# Patient Record
Sex: Female | Born: 1960 | Race: White | Hispanic: No | Smoking: Current every day smoker
Health system: Southern US, Community
[De-identification: ages and names within clinical notes are randomized; demographics above are authoritative.]

## PROBLEM LIST (undated history)

## (undated) DIAGNOSIS — F419 Anxiety disorder, unspecified: Secondary | ICD-10-CM

## (undated) DIAGNOSIS — F32A Depression, unspecified: Secondary | ICD-10-CM

## (undated) DIAGNOSIS — F319 Bipolar disorder, unspecified: Secondary | ICD-10-CM

## (undated) DIAGNOSIS — K219 Gastro-esophageal reflux disease without esophagitis: Secondary | ICD-10-CM

## (undated) DIAGNOSIS — F329 Major depressive disorder, single episode, unspecified: Secondary | ICD-10-CM

## (undated) HISTORY — PX: TUBAL LIGATION: SHX77

---

## 2000-05-13 ENCOUNTER — Inpatient Hospital Stay (HOSPITAL_COMMUNITY): Admission: EM | Admit: 2000-05-13 | Discharge: 2000-05-16 | Payer: Self-pay | Admitting: *Deleted

## 2007-10-04 ENCOUNTER — Encounter (INDEPENDENT_AMBULATORY_CARE_PROVIDER_SITE_OTHER): Payer: Self-pay | Admitting: Family Medicine

## 2007-10-16 ENCOUNTER — Encounter (INDEPENDENT_AMBULATORY_CARE_PROVIDER_SITE_OTHER): Payer: Self-pay | Admitting: Family Medicine

## 2007-10-22 ENCOUNTER — Ambulatory Visit: Payer: Self-pay | Admitting: Family Medicine

## 2007-10-22 DIAGNOSIS — K219 Gastro-esophageal reflux disease without esophagitis: Secondary | ICD-10-CM

## 2007-10-22 DIAGNOSIS — B977 Papillomavirus as the cause of diseases classified elsewhere: Secondary | ICD-10-CM

## 2007-10-22 DIAGNOSIS — F259 Schizoaffective disorder, unspecified: Secondary | ICD-10-CM

## 2007-10-24 ENCOUNTER — Ambulatory Visit: Payer: Self-pay | Admitting: Family Medicine

## 2008-02-26 ENCOUNTER — Encounter (INDEPENDENT_AMBULATORY_CARE_PROVIDER_SITE_OTHER): Payer: Self-pay | Admitting: Internal Medicine

## 2008-03-16 ENCOUNTER — Ambulatory Visit (HOSPITAL_COMMUNITY): Admission: RE | Admit: 2008-03-16 | Discharge: 2008-03-16 | Payer: Self-pay | Admitting: Family Medicine

## 2008-04-29 ENCOUNTER — Telehealth (INDEPENDENT_AMBULATORY_CARE_PROVIDER_SITE_OTHER): Payer: Self-pay | Admitting: Family Medicine

## 2008-05-07 ENCOUNTER — Encounter (INDEPENDENT_AMBULATORY_CARE_PROVIDER_SITE_OTHER): Payer: Self-pay | Admitting: Family Medicine

## 2008-08-02 ENCOUNTER — Ambulatory Visit: Payer: Self-pay | Admitting: Family Medicine

## 2008-08-13 ENCOUNTER — Encounter (INDEPENDENT_AMBULATORY_CARE_PROVIDER_SITE_OTHER): Payer: Self-pay | Admitting: Family Medicine

## 2008-08-15 DIAGNOSIS — R51 Headache: Secondary | ICD-10-CM | POA: Insufficient documentation

## 2008-08-15 DIAGNOSIS — R519 Headache, unspecified: Secondary | ICD-10-CM | POA: Insufficient documentation

## 2008-09-19 ENCOUNTER — Emergency Department (HOSPITAL_BASED_OUTPATIENT_CLINIC_OR_DEPARTMENT_OTHER): Admission: EM | Admit: 2008-09-19 | Discharge: 2008-09-19 | Payer: Self-pay | Admitting: Emergency Medicine

## 2008-10-21 ENCOUNTER — Ambulatory Visit: Payer: Self-pay | Admitting: *Deleted

## 2008-10-21 ENCOUNTER — Inpatient Hospital Stay (HOSPITAL_COMMUNITY): Admission: EM | Admit: 2008-10-21 | Discharge: 2008-10-27 | Payer: Self-pay | Admitting: *Deleted

## 2009-02-18 ENCOUNTER — Telehealth (INDEPENDENT_AMBULATORY_CARE_PROVIDER_SITE_OTHER): Payer: Self-pay | Admitting: Internal Medicine

## 2009-03-11 ENCOUNTER — Ambulatory Visit: Payer: Self-pay | Admitting: Physician Assistant

## 2009-03-29 ENCOUNTER — Telehealth: Payer: Self-pay | Admitting: Physician Assistant

## 2009-04-15 ENCOUNTER — Ambulatory Visit (HOSPITAL_COMMUNITY): Admission: RE | Admit: 2009-04-15 | Discharge: 2009-04-15 | Payer: Self-pay | Admitting: Family Medicine

## 2010-04-13 ENCOUNTER — Inpatient Hospital Stay (HOSPITAL_COMMUNITY): Admission: RE | Admit: 2010-04-13 | Discharge: 2010-04-20 | Payer: Self-pay | Admitting: Psychiatry

## 2010-04-13 ENCOUNTER — Ambulatory Visit: Payer: Self-pay | Admitting: Psychiatry

## 2010-09-20 ENCOUNTER — Emergency Department (HOSPITAL_COMMUNITY)
Admission: EM | Admit: 2010-09-20 | Discharge: 2010-09-21 | Disposition: A | Discharge status: Other Acute Inpatient Hospital | Payer: Medicare Other | Source: Home / Self Care | Attending: Emergency Medicine | Admitting: Emergency Medicine

## 2010-09-20 DIAGNOSIS — R45851 Suicidal ideations: Secondary | ICD-10-CM | POA: Insufficient documentation

## 2010-09-20 DIAGNOSIS — F259 Schizoaffective disorder, unspecified: Secondary | ICD-10-CM | POA: Insufficient documentation

## 2010-09-20 DIAGNOSIS — Z79899 Other long term (current) drug therapy: Secondary | ICD-10-CM | POA: Insufficient documentation

## 2010-09-20 DIAGNOSIS — Z880 Allergy status to penicillin: Secondary | ICD-10-CM | POA: Insufficient documentation

## 2010-09-20 DIAGNOSIS — F319 Bipolar disorder, unspecified: Secondary | ICD-10-CM | POA: Insufficient documentation

## 2010-09-20 DIAGNOSIS — F17200 Nicotine dependence, unspecified, uncomplicated: Secondary | ICD-10-CM | POA: Insufficient documentation

## 2010-09-20 LAB — DIFFERENTIAL
Eosinophils Absolute: 0 10*3/uL (ref 0.0–0.7)
Eosinophils Relative: 1 % (ref 0–5)
Lymphs Abs: 1.9 10*3/uL (ref 0.7–4.0)

## 2010-09-20 LAB — URINE MICROSCOPIC-ADD ON

## 2010-09-20 LAB — COMPREHENSIVE METABOLIC PANEL
Albumin: 4 g/dL (ref 3.5–5.2)
BUN: 9 mg/dL (ref 6–23)
Creatinine, Ser: 0.76 mg/dL (ref 0.4–1.2)
Potassium: 3.1 mEq/L — ABNORMAL LOW (ref 3.5–5.1)
Total Protein: 7 g/dL (ref 6.0–8.3)

## 2010-09-20 LAB — URINALYSIS, ROUTINE W REFLEX MICROSCOPIC
Bilirubin Urine: NEGATIVE
Nitrite: NEGATIVE
Specific Gravity, Urine: 1.013 (ref 1.005–1.030)
Urobilinogen, UA: 0.2 mg/dL (ref 0.0–1.0)

## 2010-09-20 LAB — RAPID URINE DRUG SCREEN, HOSP PERFORMED
Amphetamines: NOT DETECTED
Tetrahydrocannabinol: NOT DETECTED

## 2010-09-20 LAB — CBC
MCH: 30.8 pg (ref 26.0–34.0)
MCV: 92 fL (ref 78.0–100.0)
Platelets: 315 10*3/uL (ref 150–400)
RDW: 13.6 % (ref 11.5–15.5)
WBC: 8.1 10*3/uL (ref 4.0–10.5)

## 2010-09-21 ENCOUNTER — Inpatient Hospital Stay (HOSPITAL_COMMUNITY)
Admission: AD | Admit: 2010-09-21 | Discharge: 2010-10-09 | DRG: 885 | Disposition: A | Discharge status: Nursing Home (Includes ICF) | Payer: Medicare Other | Source: Ambulatory Visit | Attending: Psychiatry | Admitting: Psychiatry

## 2010-09-21 DIAGNOSIS — Z818 Family history of other mental and behavioral disorders: Secondary | ICD-10-CM

## 2010-09-21 DIAGNOSIS — F259 Schizoaffective disorder, unspecified: Principal | ICD-10-CM

## 2010-09-21 DIAGNOSIS — R45851 Suicidal ideations: Secondary | ICD-10-CM

## 2010-09-21 DIAGNOSIS — K219 Gastro-esophageal reflux disease without esophagitis: Secondary | ICD-10-CM

## 2010-09-21 DIAGNOSIS — Z880 Allergy status to penicillin: Secondary | ICD-10-CM

## 2010-09-21 DIAGNOSIS — F17200 Nicotine dependence, unspecified, uncomplicated: Secondary | ICD-10-CM

## 2010-09-21 DIAGNOSIS — F603 Borderline personality disorder: Secondary | ICD-10-CM

## 2010-09-21 DIAGNOSIS — Z87440 Personal history of urinary (tract) infections: Secondary | ICD-10-CM

## 2010-09-21 DIAGNOSIS — Z5900 Homelessness unspecified: Secondary | ICD-10-CM

## 2010-09-21 DIAGNOSIS — F329 Major depressive disorder, single episode, unspecified: Secondary | ICD-10-CM

## 2010-09-21 DIAGNOSIS — F3289 Other specified depressive episodes: Secondary | ICD-10-CM

## 2010-09-21 DIAGNOSIS — Z560 Unemployment, unspecified: Secondary | ICD-10-CM

## 2010-09-22 LAB — URINE MICROSCOPIC-ADD ON

## 2010-09-22 LAB — URINALYSIS, ROUTINE W REFLEX MICROSCOPIC
Nitrite: NEGATIVE
Specific Gravity, Urine: 1.014 (ref 1.005–1.030)
pH: 6.5 (ref 5.0–8.0)

## 2010-09-22 LAB — TSH: TSH: 1.87 u[IU]/mL (ref 0.350–4.500)

## 2010-09-23 DIAGNOSIS — F603 Borderline personality disorder: Secondary | ICD-10-CM

## 2010-09-23 DIAGNOSIS — F259 Schizoaffective disorder, unspecified: Secondary | ICD-10-CM
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
## 2010-09-27 LAB — BASIC METABOLIC PANEL
BUN: 9 mg/dL (ref 6–23)
Creatinine, Ser: 0.78 mg/dL (ref 0.4–1.2)
GFR calc non Af Amer: >60 mL/min (ref >60)

## 2010-09-28 LAB — VITAMIN B12: Vitamin B-12: 351 pg/mL (ref 211–911)

## 2010-09-30 LAB — VITAMIN D 1,25 DIHYDROXY: Vitamin D3 1, 25 (OH)2: 68 pg/mL

## 2010-10-05 NOTE — Discharge Summary (Signed)
[name], [name]                ACCOUNT NO.:  [id number]  MEDICAL RECORD NO.:  [id number]           PATIENT TYPE:  I  LOCATION:  0502                          FACILITY:  [facility]  PHYSICIAN:  Marlis Edelson, DO        DATE OF BIRTH:  05-04-1961  DATE OF ADMISSION:  09/21/2010 DATE OF DISCHARGE:                              DISCHARGE SUMMARY   REASON FOR ADMISSION:  This is a 50 year old female with a longstanding history of mood disorder, brought in by the ACT team due to suicidal thoughts.  Patient was reporting that she was feeling very stressed out, was unable to provide details due to her symptoms.  Her stressors were that she was homeless, feeling very afraid, ruminating about past mistakes that she has made.  Denied any hallucinations.  FINAL DIAGNOSES:  Schizoaffective disorder, depressed. AXIS II:  Borderline personality disorder by history. AXIS III:  Gastroesophageal reflux disease symptoms. AXIS IV:  Psychosocial stressors, burden of illness. AXIS V:  Is 50 to 55.  SIGNIFICANT LABS:  Normal EKG.  Urine drug screen was negative.  Alcohol level less than 5.  CBC within normal limits.  SIGNIFICANT FINDINGS:  This was a middle-aged female admitted to the adult milieu in the mood disorder treatment therapy.  She appeared anxious and disheveled.  Eye contact was fair but more consistent with staring, seldom blinking.  Her motor behavior, she was initially restless, feeling very sad.  She seemed to have some paranoid ideation with poor judgment and shallow insight.  We were going to pursue placement and had Ativan available for anxiety.  Patient had periods of ruminating thoughts.  She was endorsing depressive symptoms and suicidal thoughts but no active thoughts or plan or intent.  She seems somewhat stagnated, always feeling the same.  We added Risperdal for the ruminating thoughts and trazodone for sleep.  We were weaning her Celexa due to its lack of efficacy.  Continued  Risperdal for psychosis and mood stability.  We added Rozerem for sleep as patient was not sleeping well and not feeling well, endorsing her symptoms of depression and rumination.  We continued to do some psychoeducation discussing her self- esteem issues and initiated Lamictal for mood stability and had discontinued her Rozerem and initiated Lunesta for sleep.  Patient continued on her regimen of Lamictal, Risperdal, and Lunesta, still having ruminating at night, beginning to feel somewhat better, was attending groups.  We were awaiting placement.  We started Klonopin for her anxiety.  She was feeling hopeless in regard to her homeless situation.  Her anxiety had lessened, still having some symptoms of anxiety, and was happy about placement although somewhat anxious about leaving the facility.  CONDITION UPON DISCHARGE:  Less depressed, less anxious, tolerating her medications, denying any psychotic symptoms, showing no signs of hypomania, mania, or overt anxiety.  DISCHARGE MEDICATIONS: 1. Lamictal 100 mg taking 1/2 tab daily. 2. Claritin 10 mg as needed for allergies. 3. Risperdal 3 mg 1 tab b.i.d. 4. Colace 100 mg b.i.d. 5. Klonopin 0.5 mg 1 tab b.i.d. 6. Lunesta 3 mg 1 tab q.h.s. sleep.  Patient  was to stop her BuSpar and Celexa.  Patient was to be discharged to the [facility].     Landry Corporal, N.P.   ______________________________ Marlis Edelson, DO    JO/MEDQ  D:  10/05/2010  T:  10/05/2010  Job:  [id number]  Electronically Signed by Limmie PatriciaP. on 10/05/2010 01:35:11 PM Electronically Signed by Marlis Edelson MD on 10/05/2010 09:21:34 PM

## 2010-10-17 ENCOUNTER — Emergency Department (HOSPITAL_COMMUNITY)
Admission: EM | Admit: 2010-10-17 | Discharge: 2010-10-18 | Disposition: A | Discharge status: Home / Self Care | Payer: Medicare Other | Attending: Emergency Medicine | Admitting: Emergency Medicine

## 2010-10-17 DIAGNOSIS — F319 Bipolar disorder, unspecified: Secondary | ICD-10-CM | POA: Insufficient documentation

## 2010-10-17 DIAGNOSIS — F259 Schizoaffective disorder, unspecified: Secondary | ICD-10-CM | POA: Insufficient documentation

## 2010-10-17 DIAGNOSIS — G4700 Insomnia, unspecified: Secondary | ICD-10-CM | POA: Insufficient documentation

## 2010-10-17 DIAGNOSIS — Z79899 Other long term (current) drug therapy: Secondary | ICD-10-CM | POA: Insufficient documentation

## 2010-10-17 LAB — CBC
HCT: 42.5 % (ref 36.0–46.0)
Hemoglobin: 14.3 g/dL (ref 12.0–15.0)
MCHC: 33.6 g/dL (ref 30.0–36.0)
RBC: 4.68 MIL/uL (ref 3.87–5.11)

## 2010-10-17 LAB — DIFFERENTIAL
Basophils Absolute: 0 10*3/uL (ref 0.0–0.1)
Lymphocytes Relative: 24 % (ref 12–46)
Monocytes Absolute: 0.9 10*3/uL (ref 0.1–1.0)
Monocytes Relative: 9 % (ref 3–12)
Neutro Abs: 6.8 10*3/uL (ref 1.7–7.7)

## 2010-10-18 LAB — ETHANOL: Alcohol, Ethyl (B): <5 mg/dL (ref 0–10)

## 2010-10-18 LAB — RAPID URINE DRUG SCREEN, HOSP PERFORMED
Amphetamines: NOT DETECTED
Benzodiazepines: NOT DETECTED
Cocaine: NOT DETECTED
Opiates: NOT DETECTED
Tetrahydrocannabinol: NOT DETECTED

## 2010-10-18 LAB — COMPREHENSIVE METABOLIC PANEL
Albumin: 4 g/dL (ref 3.5–5.2)
BUN: 13 mg/dL (ref 6–23)
Calcium: 9.3 mg/dL (ref 8.4–10.5)
Creatinine, Ser: 0.86 mg/dL (ref 0.4–1.2)
Potassium: 4 mEq/L (ref 3.5–5.1)
Total Protein: 7.3 g/dL (ref 6.0–8.3)

## 2010-10-21 ENCOUNTER — Emergency Department (HOSPITAL_COMMUNITY)
Admission: EM | Admit: 2010-10-21 | Discharge: 2010-10-22 | Disposition: A | Discharge status: Other Acute Inpatient Hospital | Payer: Medicare Other | Source: Home / Self Care | Attending: Emergency Medicine | Admitting: Emergency Medicine

## 2010-10-21 DIAGNOSIS — R443 Hallucinations, unspecified: Secondary | ICD-10-CM | POA: Insufficient documentation

## 2010-10-21 DIAGNOSIS — F329 Major depressive disorder, single episode, unspecified: Secondary | ICD-10-CM | POA: Insufficient documentation

## 2010-10-21 DIAGNOSIS — R45851 Suicidal ideations: Secondary | ICD-10-CM | POA: Insufficient documentation

## 2010-10-21 DIAGNOSIS — F3289 Other specified depressive episodes: Secondary | ICD-10-CM | POA: Insufficient documentation

## 2010-10-21 LAB — DIFFERENTIAL
Lymphocytes Relative: 25 % (ref 12–46)
Lymphs Abs: 2.4 10*3/uL (ref 0.7–4.0)
Monocytes Relative: 8 % (ref 3–12)
Neutro Abs: 6.1 10*3/uL (ref 1.7–7.7)
Neutrophils Relative %: 65 % (ref 43–77)

## 2010-10-21 LAB — COMPREHENSIVE METABOLIC PANEL
ALT: 15 U/L (ref 0–35)
AST: 16 U/L (ref 0–37)
Albumin: 3.6 g/dL (ref 3.5–5.2)
CO2: 26 mEq/L (ref 19–32)
Chloride: 103 mEq/L (ref 96–112)
Creatinine, Ser: 0.84 mg/dL (ref 0.4–1.2)
GFR calc Af Amer: >60 mL/min (ref >60)
Potassium: 4.1 mEq/L (ref 3.5–5.1)
Sodium: 136 mEq/L (ref 135–145)
Total Bilirubin: 0.3 mg/dL (ref 0.3–1.2)

## 2010-10-21 LAB — CBC
Hemoglobin: 13.9 g/dL (ref 12.0–15.0)
MCH: 31 pg (ref 26.0–34.0)
MCV: 92.7 fL (ref 78.0–100.0)
RBC: 4.49 MIL/uL (ref 3.87–5.11)
WBC: 9.4 10*3/uL (ref 4.0–10.5)

## 2010-10-21 LAB — RAPID URINE DRUG SCREEN, HOSP PERFORMED
Amphetamines: NOT DETECTED
Benzodiazepines: NOT DETECTED
Cocaine: NOT DETECTED

## 2010-10-22 ENCOUNTER — Inpatient Hospital Stay (HOSPITAL_COMMUNITY)
Admission: EM | Admit: 2010-10-22 | Discharge: 2010-10-26 | DRG: 885 | Disposition: A | Discharge status: Home / Self Care | Payer: Medicare Other | Source: Intra-hospital | Attending: Psychiatry | Admitting: Psychiatry

## 2010-10-22 DIAGNOSIS — Z87440 Personal history of urinary (tract) infections: Secondary | ICD-10-CM

## 2010-10-22 DIAGNOSIS — Z5900 Homelessness unspecified: Secondary | ICD-10-CM

## 2010-10-22 DIAGNOSIS — F39 Unspecified mood [affective] disorder: Secondary | ICD-10-CM

## 2010-10-22 DIAGNOSIS — F603 Borderline personality disorder: Secondary | ICD-10-CM

## 2010-10-22 DIAGNOSIS — F329 Major depressive disorder, single episode, unspecified: Secondary | ICD-10-CM

## 2010-10-22 DIAGNOSIS — F3289 Other specified depressive episodes: Secondary | ICD-10-CM

## 2010-10-22 DIAGNOSIS — R45851 Suicidal ideations: Secondary | ICD-10-CM

## 2010-10-22 DIAGNOSIS — F259 Schizoaffective disorder, unspecified: Principal | ICD-10-CM

## 2010-10-22 DIAGNOSIS — F17200 Nicotine dependence, unspecified, uncomplicated: Secondary | ICD-10-CM

## 2010-10-23 LAB — URINE MICROSCOPIC-ADD ON

## 2010-10-23 LAB — URINALYSIS, ROUTINE W REFLEX MICROSCOPIC
Bilirubin Urine: NEGATIVE
Glucose, UA: NEGATIVE mg/dL
Hgb urine dipstick: NEGATIVE
Protein, ur: NEGATIVE mg/dL
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
## 2010-10-25 DIAGNOSIS — F259 Schizoaffective disorder, unspecified: Secondary | ICD-10-CM

## 2010-10-30 NOTE — Discharge Summary (Signed)
  NAME:  [name], [name]                ACCOUNT NO.:  [id number]  MEDICAL RECORD NO.:  [id number]           PATIENT TYPE:  I  LOCATION:  0407                          FACILITY:  [facility]  PHYSICIAN:  Eulogio Ditch, MD DATE OF BIRTH:  [birth year]  DATE OF ADMISSION:  10/22/2010 DATE OF DISCHARGE:  10/26/2010                              DISCHARGE SUMMARY   HOSPITAL COURSE:  A 50 year old Caucasian female with diagnosis of schizoaffective disorder followed by the ACT Team in the outpatient setting who was admitted from [facility] ED after she was having depressed mood and suicidal ideations.  The patient also reported hearing voices at the time of admission.  During the hospital stay the patient participated in all the groups. She was continued on current combination of her medications Risperdal 3 mg twice a day, Celexa 40 mg daily and Tegretol 200 mg 2 tablets at bedtime along with BuSpar 10 mg b.i.d.  The patient responded to the medication well.  She went to all the groups and her behavior remained under control during the stay in the hospital.  The patient currently does not like the group home where she is living, she want to stay at [facility] she gets from disability 1300 dollars and she told me that for a month [employer] rent is 525.  She wants to follow up with the ACT team to find another group home after discharge from the hospital.  On October 26, 2010 when I saw the patient.  The patient is mentally stable, not hearing any voices.  Denied any suicidal ideations.  Her attention and concentration is good.  Abstraction ability is good. Insight and judgment improved.  Her memory immediate, recent remote intact.  As the patient was found stable to be discharged, the patient was discharged to follow up with [facility] team.  DISCHARGE DIAGNOSES:  AXIS I: Schizoaffective disorder mixed type. AXIS II: Deferred. AXIS III: No active medical issue. AXIS IV: Chronic mental issues. AXIS V:  50.  DISCHARGE MEDICATIONS: 1. Psych medication Celexa 40 mg p.o. daily. 2. Risperdal 3 mg b.i.d. 3. Tegretol 200 mg 12 tablets at bedtime. 4. BuSpar 10 mg t.i.d. 5. Klonopin 0.5 mg b.i.d.  DISCHARGE FOLLOWUP:  The patient with follow-up with the [facility] in the outpatient setting.     Eulogio Ditch, MD     SA/MEDQ  D:  10/26/2010  T:  10/26/2010  Job:  [id number]  Electronically Signed by Eulogio Ditch  on 10/30/2010 11:50:02 AM

## 2010-11-01 LAB — COMPREHENSIVE METABOLIC PANEL
ALT: 18 U/L (ref 0–35)
AST: 21 U/L (ref 0–37)
Albumin: 4.2 g/dL (ref 3.5–5.2)
Alkaline Phosphatase: 113 U/L (ref 39–117)
BUN: 9 mg/dL (ref 6–23)
CO2: 29 mEq/L (ref 19–32)
Calcium: 9.9 mg/dL (ref 8.4–10.5)
Chloride: 102 mEq/L (ref 96–112)
Creatinine, Ser: 0.94 mg/dL (ref 0.4–1.2)
GFR calc Af Amer: >60 mL/min (ref >60)
GFR calc non Af Amer: >60 mL/min (ref >60)
Glucose, Bld: 80 mg/dL (ref 70–99)
Potassium: 3.9 mEq/L (ref 3.5–5.1)
Sodium: 139 mEq/L (ref 135–145)
Total Bilirubin: 0.7 mg/dL (ref 0.3–1.2)
Total Protein: 7.5 g/dL (ref 6.0–8.3)

## 2010-11-01 LAB — CBC
Platelets: 325 10*3/uL (ref 150–400)
RBC: 5.1 MIL/uL (ref 3.87–5.11)
WBC: 8 10*3/uL (ref 4.0–10.5)

## 2010-11-01 LAB — URINALYSIS, ROUTINE W REFLEX MICROSCOPIC
Bilirubin Urine: NEGATIVE
Glucose, UA: NEGATIVE mg/dL
Ketones, ur: NEGATIVE mg/dL
Nitrite: NEGATIVE
Protein, ur: NEGATIVE mg/dL
Specific Gravity, Urine: 1.003 — ABNORMAL LOW (ref 1.005–1.030)
Urobilinogen, UA: 0.2 mg/dL (ref 0.0–1.0)
pH: 6 (ref 5.0–8.0)

## 2010-11-01 LAB — URINE MICROSCOPIC-ADD ON

## 2010-11-01 LAB — VITAMIN B12: Vitamin B-12: 758 pg/mL (ref 211–911)

## 2010-11-01 LAB — DRUGS OF ABUSE SCREEN W/O ALC, ROUTINE URINE
Amphetamine Screen, Ur: NEGATIVE
Barbiturate Quant, Ur: NEGATIVE
Benzodiazepines.: NEGATIVE
Cocaine Metabolites: NEGATIVE
Creatinine,U: 55.7 mg/dL
Marijuana Metabolite: NEGATIVE
Methadone: NEGATIVE
Opiate Screen, Urine: NEGATIVE
Phencyclidine (PCP): NEGATIVE
Propoxyphene: NEGATIVE

## 2010-11-01 LAB — LIPID PANEL
Cholesterol: 181 mg/dL (ref 0–200)
HDL: 43 mg/dL (ref >39)
LDL Cholesterol: 123 mg/dL — ABNORMAL HIGH (ref 0–99)
Total CHOL/HDL Ratio: 4.2 RATIO
Triglycerides: 74 mg/dL (ref <150)
VLDL: 15 mg/dL (ref 0–40)

## 2010-11-01 LAB — HEMOGLOBIN A1C: Hgb A1c MFr Bld: 5.6 % (ref 4.6–6.1)

## 2010-11-01 LAB — RPR: RPR Ser Ql: NONREACTIVE

## 2010-11-01 LAB — TSH: TSH: 2.135 u[IU]/mL (ref 0.350–4.500)

## 2010-11-07 LAB — COMPREHENSIVE METABOLIC PANEL
ALT: 33 U/L (ref 0–35)
AST: 39 U/L — ABNORMAL HIGH (ref 0–37)
Albumin: 5.1 g/dL (ref 3.5–5.2)
Alkaline Phosphatase: 127 U/L — ABNORMAL HIGH (ref 39–117)
Chloride: 103 mEq/L (ref 96–112)
GFR calc Af Amer: >60 mL/min (ref >60)
Potassium: 4.4 mEq/L (ref 3.5–5.1)
Total Bilirubin: 0.9 mg/dL (ref 0.3–1.2)

## 2010-11-07 LAB — URINE MICROSCOPIC-ADD ON

## 2010-11-07 LAB — DIFFERENTIAL
Basophils Absolute: 0.1 10*3/uL (ref 0.0–0.1)
Basophils Relative: 1 % (ref 0–1)
Eosinophils Relative: 0 % (ref 0–5)
Monocytes Absolute: 0.9 10*3/uL (ref 0.1–1.0)

## 2010-11-07 LAB — ETHANOL: Alcohol, Ethyl (B): <5 mg/dL (ref 0–10)

## 2010-11-07 LAB — URINALYSIS, ROUTINE W REFLEX MICROSCOPIC
Bilirubin Urine: NEGATIVE
Protein, ur: NEGATIVE mg/dL
Urobilinogen, UA: 0.2 mg/dL (ref 0.0–1.0)

## 2010-11-07 LAB — CBC
HCT: 46.7 % — ABNORMAL HIGH (ref 36.0–46.0)
Platelets: 197 10*3/uL (ref 150–400)
WBC: 13.3 10*3/uL — ABNORMAL HIGH (ref 4.0–10.5)

## 2010-11-20 ENCOUNTER — Emergency Department (HOSPITAL_COMMUNITY)
Admission: EM | Admit: 2010-11-20 | Discharge: 2010-11-20 | Disposition: A | Discharge status: Home / Self Care | Payer: Medicare Other | Attending: Emergency Medicine | Admitting: Emergency Medicine

## 2010-11-20 DIAGNOSIS — F209 Schizophrenia, unspecified: Secondary | ICD-10-CM | POA: Insufficient documentation

## 2010-11-20 DIAGNOSIS — F319 Bipolar disorder, unspecified: Secondary | ICD-10-CM | POA: Insufficient documentation

## 2010-11-20 DIAGNOSIS — Z139 Encounter for screening, unspecified: Secondary | ICD-10-CM | POA: Insufficient documentation

## 2010-11-20 LAB — COMPREHENSIVE METABOLIC PANEL
ALT: 12 U/L (ref 0–35)
AST: 19 U/L (ref 0–37)
Albumin: 3.6 g/dL (ref 3.5–5.2)
Alkaline Phosphatase: 88 U/L (ref 39–117)
CO2: 28 mEq/L (ref 19–32)
Chloride: 100 mEq/L (ref 96–112)
Creatinine, Ser: 0.81 mg/dL (ref 0.4–1.2)
GFR calc Af Amer: >60 mL/min (ref >60)
GFR calc non Af Amer: >60 mL/min (ref >60)
Potassium: 3.8 mEq/L (ref 3.5–5.1)
Total Bilirubin: 0.5 mg/dL (ref 0.3–1.2)

## 2010-11-20 LAB — CBC
Hemoglobin: 14.1 g/dL (ref 12.0–15.0)
MCH: 30.9 pg (ref 26.0–34.0)
RBC: 4.57 MIL/uL (ref 3.87–5.11)
WBC: 8.2 10*3/uL (ref 4.0–10.5)

## 2010-11-20 LAB — DIFFERENTIAL
Basophils Relative: 0 % (ref 0–1)
Lymphs Abs: 1.5 10*3/uL (ref 0.7–4.0)
Monocytes Relative: 7 % (ref 3–12)
Neutro Abs: 6 10*3/uL (ref 1.7–7.7)
Neutrophils Relative %: 73 % (ref 43–77)

## 2010-11-20 LAB — RAPID URINE DRUG SCREEN, HOSP PERFORMED
Benzodiazepines: POSITIVE — AB
Tetrahydrocannabinol: NOT DETECTED

## 2010-12-08 NOTE — H&P (Signed)
[facility]  Patient:    [name], [name]                         MRN: [id number] Adm. Date:  [id number] Attending:  Denny Peon Dictator:   Landry Corporal, NP                   Psychiatric Admission Assessment  PATIENT IDENTIFICATION:  This is a 50 year old married white female, voluntarily admitted to [facility] on May 13, 2000, transferred from [facility] for psychotic behavior.  HISTORY OF PRESENT ILLNESS:  The patient was found by her husband on May 12, 2000 crouching in a fetal position at home, having repetitive behavior and bizarre thinking.  Patient was having visual and auditory hallucinations with some paranoia and no suicidal ideation.  She spent a day at [facility] for stabilization of her behavior and transferred here for continued therapy.  Patient reports having problems with focusing.  She says her sleep has been decreased.  She said her appetite fluctuates.  She states she does hear like scrambled voices and a quiet noise since July, not every day but every day for the past week.  She also states that when she watches TV she knew that things were wrong, but that she was not seeing things the same way as others.  She also states she has been having some mood swings and some irritability for the past week or so.  PAST PSYCHIATRIC HISTORY:  In 1995, she was seeing a psychiatrist for depression and was put on Zoloft until about 1997, and has not been taking it any further.  She has not been on any kind of antidepressants for the past several years.  SUBSTANCE ABUSE HISTORY:  She is a nonsmoker.  She will drink an occasional couple of beers on the weekend.  She denies any recreational drug use.  PAST MEDICAL HISTORY:  Primary care Morey Andonian:  She goes to [facility] for her problems.  Has no significant primary care Christyl Osentoski.  Medical problems:  She states that she thought she  had a heart murmur in the past, but believes there is no heart problem at this current time.  Medication:  She takes Imigran for headache p.r.n., BuSpar t.i.d., unsure of her dosage, for anxiety on a p.r.n. basis since July.  She states it was effective.  The BuSpar was prescribed by [facility] by Harriett Sine, she is not sure of her title.  DRUG ALLERGIES:  She is allergic to PENICILLIN.  She gets hives.  PHYSICAL EXAMINATION:  Performed at [facility].  Her CBC and Chem 14 were within normal limits.  No other significant findings.  SOCIAL HISTORY:  A 50 year old married white female, been married for 20 years.  She has one child, a 48 year old son, lives with her husband and child.  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  She states that she enjoys her work.  She has completed high school and one year of technical school.  She has no financial and legal problems.  FAMILY HISTORY:  She has a brother who is bipolar and on lithium.  Father is schizophrenic, diagnosed in his early 86s.  MENTAL STATUS EXAMINATION:  Alert middle-aged, mildly overweight white female. She is cooperative, pleasant, good eye contact and she is calm.  Speech is normal and relevant.  Mood is euthymic.  Affect is euthymic.  Thought processes are positive for  auditory hallucinations, no command hallucinations. She does hear scrambled voices and low noises.  Medical visual hallucinations at present.  Currently no suicidal ideation or homicidal ideation.  No delusions, no paranoia.  Cognitive:  She is oriented x 3.  Her recent memory is fair.  She is average intelligence.  Her insight is fair.  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            2. Rule out schizophrenia. Axis II:   No diagnosis. Axis III:  Headaches. Axis IV:   Moderate, with occupational problem. Axis V:    Current is 30, past year is 63.  INITIAL PLAN OF CARE:  Voluntary  admission to [facility] for psychotic behavior.  Contract for safety, check every 15 minutes.  Continue with Risperdal 2 mg h.s., 1 mg q.6h. p.r.n. for hallucinations.  Seroquel ordered for sleep, BuSpar 5 mg t.i.d., Motrin for headache.  Goal:  To provide a safe environment.  ESTIMATED LENGTH OF STAY:  Three to five days.  CONDITIONS NECESSARY FOR DISCHARGE:  Patient is to be discharged with improved mood stabilization and free of psychotic behavior. DD:  05/14/00 TD:  05/14/00 Job: [id number]

## 2010-12-08 NOTE — Discharge Summary (Signed)
[name], [name] NO.:  [id number]   MEDICAL RECORD NO.:  [id number]          PATIENT TYPE:  IPS   LOCATION:  0403                          FACILITY:  [facility]   PHYSICIAN:  Jasmine Pang, M.D. DATE OF BIRTH:  February 08, 1961   DATE OF ADMISSION:  10/21/2008  DATE OF DISCHARGE:  10/27/2008                               DISCHARGE SUMMARY   IDENTIFICATION:  This is a 50 year old Caucasian female who was admitted  on involuntary basis on October 21, 2008.   HISTORY OF PRESENT ILLNESS:  The patient presents with 1 week of  suicidal ideation at her group home.  She refused her IM Prolixin  injection 50 mg, last October 02, 2008, because she feels it is not  working and she had side effects.  She wants to kill herself by cutting  herself or stabbing herself.  She has a history of schizoaffective  disorder, borderline personality disorder, has had multiple psychiatric  hospitalizations.  She was seen at the [facility].  For further  admission information, see psychiatric admission assessment.   PHYSICAL FINDINGS:  There were no acute physical or medical problems  noted.   DIAGNOSTIC STUDIES:  A CBC without diff was remarkable for elevated  hemoglobin of 16.3 and elevated hematocrit of 47.2.  A basic metabolic  panel was grossly within normal limits.  A hepatic profile was grossly  within normal limits.  Hemoglobin A1c was within normal limits at 5.6.  Lipid profile was grossly within normal limits except for slightly high  LDL cholesterol 123 (0-99), TSH was within normal limits at 2.135, was  758, which was within normal limits.  Urine drug screen was negative.  Urinalysis was positive for moderate leukocyte esterase with rare  bacteria and 3-6 wbc's.  She was not symptomatic of a urinary tract  infection.  RPR was nonreactive.   HOSPITAL COURSE:  Upon admission, the patient was started on Effexor XR  150 mg b.i.d., trihexyphenidyl 2 mg t.i.d., trazodone 100 mg at  h.s.,  Lamictal 50 mg p.o. b.i.d., lorazepam 0.5 mg t.i.d., and Lexapro 20 mg  p.o. daily.  She was also on Prolixin IM, last dose October 02, 2008, she  refused it on October 21, 2008 at mental health.  She has been taking the  Prolixin every 3 weeks and reportedly had an episode of acroesthesia.  She was started on trazodone 100 mg p.o. q.h.s. may repeat x1 p.r.n.  She was started on a multivitamin daily and Seroquel 25 mg in the  morning and 200 mg at h.s.  On October 24, 2008, the patient developed  gastroenteritis and had to be put on contact precautions.  In individual  sessions, the patient was initially anxious and tremulous with restless  legs with positive suicidal ideation, but denied any auditory  hallucinations or visual hallucinations or homicidal ideation.  Thought  processes were slow.  There was some paranoid ideation, but no delusion.  As hospitalization progressed, she continued to be depressed and anxious  with positive psychomotor retardation.  Speech soft and slow.  Poverty  of thought, disorganized  thinking.  She stayed in bed and does not feel  ready for discharge.  On October 25, 2008, the patient was less depressed  and less anxious.  She felt the medication had been helpful.  There was  no suicidal ideation.  Our case manager was going to contact the group  home for her to return there.  On October 27, 2008, the patient was fully  alert.  She reports she feels much better.  She feels she has done well  on the Seroquel 200 mg q.h.s.  She has had no episodes of EPS or tardive  symptoms while here.  She is pleasant and requesting to go home.  There  was no suicidal or homicidal ideation.  No thoughts of self-injurious  behavior.  No auditory or visual hallucinations.  No paranoia or  delusions.  Thoughts were logical, goal-directed.  Thought content, no  predominant theme.  Cognitive was grossly intact.  It was felt the  patient was safe for discharge today.   DISCHARGE  DIAGNOSES:  Axis I: Schizoaffective disorder.  Axis II: Borderline personality disorder by history.  Axis III: No diagnosis.  Axis IV: Moderate (problems with primary support group, other  psychosocial problem, burden of psychiatric illness).  Axis V: Global assessment of functioning was 50 upon discharge.  GAF was  35 upon admission.  GAF highest past year was 55-60.   DISCHARGE PLANS:  There was no specific activity level or dietary  restriction.   The patient will be seen by the [facility] ACT Team on October 29, 2008 in the  morning.   DISCHARGE MEDICATIONS:  1. Effexor XR 150 mg twice a day.  2. Artane 2 mg 3 times a day.  3. Lorazepam 0.5 mg 3 times a day.  4. Lexapro 20 mg daily.  5. Lamictal 50 mg twice a day.  6. Seroquel 25 mg in the morning and 200 mg at bedtime.      Jasmine Pang, M.D.  Electronically Signed     BHS/MEDQ  D:  11/10/2008  T:  11/11/2008  Job:  [id number]

## 2010-12-08 NOTE — Discharge Summary (Signed)
[facility]  Patient:    [name], [name]                         MRN: [id number] Adm. Date:  [id number] Disc. Date: [id number] Attending:  Hipolito Bayley J                           Discharge Summary  INTRODUCTION:  The patient is a 50 year old white married female who was admitted on involuntary papers because of psychotic behavior.  She was transferred from [facility] due to insurance ______ . Apparently on October 21, she was found by husband at home with bizarre thinking, repetitive behavior, and very paranoid and apparently responding to auditory hallucinations.  The patient was admitted to [facility] and transferred after two days.  At the time of initial examination, she still hallucinated and had some problems with clear thinking with limited insight into her situation.  Details of the background information are available in admission note.  ADMISSION DIAGNOSES: Axis I:    1. Psychosis, not otherwise specified.            2. Rule out schizophrenia. Axis II:   No diagnosis. Axis III:  Headache. Axis IV:   Moderate; occupational problems. Axis V:    Global assessment of functioning upon admission was 30, highest            over the past year was 70.  PAST MEDICAL HISTORY:  The patient suffers from headaches but there were no abnormalities in initial physical examination.  HOSPITAL COURSE:  After admission to the ward, the patient was placed on special observation and Risperdal 2 mg at bedtime was started and added BuSpar 5 mg three times a day for symptoms of anxiety.  Seroquel was used for insomnia.  On October 23, brain scan was ordered, unfortunately could not be scheduled until October 30.  Because of still existing hallucinations, Risperdal was increased to 0.5 mg twice a day and 2 mg at bedtime.  I perceived the patient as being depressed and started her on Zoloft 25 mg at noon, increased next day to 50 mg  daily.  The patient started sleeping better, felt overall drowsy on Seroquel and tired on Risperdal.  Because of improvement in psychosis and absence of hallucinations or dangerous ideations, I decreased Risperdal to total daily dose of 2.5 mg and discontinued Seroquel.  BuSpar was increased to 7.5 mg three times a day to deal both with anxiety and depression.  Medical problems: The patient had her basic blood work at [facility] which was essentially normal.  I checked thyroid function tests which were normal.  Vital signs were stable throughout the hospitalization with blood pressure 120/80, normal pulse, respiratory rate, and normal temperature.  On October 25, she was much better.  I had a chance to talk to the patients husband who felt that she improved markedly and that she could be safely discharged home.  Upon discharge, I decreased Risperdal, giving both the patient and her husband warning that we had be informed if any recurrence of psychotic features.  There were no delusions, ideas of reference present, further decrease in suspiciousness and improvement in depression.  Decision was made to discharge the patient for outpatient treatment.  The patient, during hospital stay, did not experience any side effects from medication which basics were explained to her.  DISCHARGE DIAGNOSES: Axis I:  1. Major depressive disorder, moderate to severe, recurrent with               psychotic features.            2. Rule out schizoaffective disorder, depressed. Axis II:   No diagnosis. Axis III:  Migraine headaches, by history. Axis IV:   Moderate stressor; occupational problems. Axis V:    Global assessment of functioning upon admission 30, upon discharge            55, maximum for past year 70.  FOLLOWUP: 1. Dr. Izola Price on Friday, November 9 at 3 p.m. 2. She is supposed to have CT scan of the brain on May 17, 2000.  DISCHARGE MEDICATIONS: 1. Risperdal 0.5 mg at noon and  2 mg at bedtime. 2. BuSpar 7.5 mg three times a day. 3. Ambien 5 mg p.r.n. insomnia. 4. Zoloft 50 mg once a day.  PROGNOSIS:  Fair providing compliance with treatment and continuous treatment with medication.  Organicity has to be ruled out through already scheduled brain scan.  Both the patient and family are aware of about this.  DISCHARGE RECOMMENDATIONS:  If any recurrence of depression or medication problems, she should call mental health services.  ______ was stable throughout this brief hospitalization with blood pressure 130/85.  The patient was advised to follow up with family doctor for possible antihypertensive medication if indicated in the future. DD:  06/01/00 TD:  06/01/00 Job: [id number]

## 2010-12-19 ENCOUNTER — Emergency Department (HOSPITAL_COMMUNITY)
Admission: EM | Admit: 2010-12-19 | Discharge: 2010-12-20 | Disposition: A | Discharge status: Home / Self Care | Payer: Medicare Other | Attending: Emergency Medicine | Admitting: Emergency Medicine

## 2010-12-19 DIAGNOSIS — R112 Nausea with vomiting, unspecified: Secondary | ICD-10-CM | POA: Insufficient documentation

## 2010-12-19 DIAGNOSIS — Z79899 Other long term (current) drug therapy: Secondary | ICD-10-CM | POA: Insufficient documentation

## 2010-12-19 DIAGNOSIS — F259 Schizoaffective disorder, unspecified: Secondary | ICD-10-CM | POA: Insufficient documentation

## 2010-12-20 LAB — COMPREHENSIVE METABOLIC PANEL
ALT: 9 U/L (ref 0–35)
AST: 14 U/L (ref 0–37)
Albumin: 3.7 g/dL (ref 3.5–5.2)
CO2: 29 mEq/L (ref 19–32)
Calcium: 9.1 mg/dL (ref 8.4–10.5)
GFR calc Af Amer: >60 mL/min (ref >60)
GFR calc non Af Amer: >60 mL/min (ref >60)
Sodium: 136 mEq/L (ref 135–145)

## 2010-12-31 ENCOUNTER — Emergency Department (HOSPITAL_COMMUNITY)
Admission: EM | Admit: 2010-12-31 | Discharge: 2011-01-01 | Disposition: A | Discharge status: Home / Self Care | Payer: Medicare Other | Attending: Emergency Medicine | Admitting: Emergency Medicine

## 2010-12-31 DIAGNOSIS — R112 Nausea with vomiting, unspecified: Secondary | ICD-10-CM | POA: Insufficient documentation

## 2010-12-31 DIAGNOSIS — F319 Bipolar disorder, unspecified: Secondary | ICD-10-CM | POA: Insufficient documentation

## 2010-12-31 DIAGNOSIS — Z79899 Other long term (current) drug therapy: Secondary | ICD-10-CM | POA: Insufficient documentation

## 2010-12-31 DIAGNOSIS — K219 Gastro-esophageal reflux disease without esophagitis: Secondary | ICD-10-CM | POA: Insufficient documentation

## 2011-01-09 ENCOUNTER — Other Ambulatory Visit: Payer: Self-pay | Admitting: Family Medicine

## 2011-01-09 DIAGNOSIS — Z1231 Encounter for screening mammogram for malignant neoplasm of breast: Secondary | ICD-10-CM

## 2011-01-18 ENCOUNTER — Ambulatory Visit
Admission: RE | Admit: 2011-01-18 | Discharge: 2011-01-18 | Disposition: A | Discharge status: Home / Self Care | Payer: Medicare Other | Source: Ambulatory Visit | Attending: Family Medicine | Admitting: Family Medicine

## 2011-01-18 DIAGNOSIS — Z1231 Encounter for screening mammogram for malignant neoplasm of breast: Secondary | ICD-10-CM

## 2011-03-11 ENCOUNTER — Emergency Department (HOSPITAL_COMMUNITY)
Admission: EM | Admit: 2011-03-11 | Discharge: 2011-03-11 | Disposition: A | Discharge status: Other Acute Inpatient Hospital | Payer: Medicare Other | Source: Home / Self Care | Attending: Emergency Medicine | Admitting: Emergency Medicine

## 2011-03-11 ENCOUNTER — Inpatient Hospital Stay (HOSPITAL_COMMUNITY)
Admission: RE | Admit: 2011-03-11 | Discharge: 2011-03-15 | DRG: 885 | Disposition: A | Discharge status: Nursing Home (Includes ICF) | Payer: Medicare Other | Source: Ambulatory Visit | Attending: Psychiatry | Admitting: Psychiatry

## 2011-03-11 DIAGNOSIS — Z560 Unemployment, unspecified: Secondary | ICD-10-CM

## 2011-03-11 DIAGNOSIS — Z79899 Other long term (current) drug therapy: Secondary | ICD-10-CM

## 2011-03-11 DIAGNOSIS — Z818 Family history of other mental and behavioral disorders: Secondary | ICD-10-CM

## 2011-03-11 DIAGNOSIS — F17200 Nicotine dependence, unspecified, uncomplicated: Secondary | ICD-10-CM

## 2011-03-11 DIAGNOSIS — K219 Gastro-esophageal reflux disease without esophagitis: Secondary | ICD-10-CM

## 2011-03-11 DIAGNOSIS — F329 Major depressive disorder, single episode, unspecified: Secondary | ICD-10-CM | POA: Insufficient documentation

## 2011-03-11 DIAGNOSIS — R45851 Suicidal ideations: Secondary | ICD-10-CM

## 2011-03-11 DIAGNOSIS — F3289 Other specified depressive episodes: Secondary | ICD-10-CM | POA: Insufficient documentation

## 2011-03-11 DIAGNOSIS — Z6379 Other stressful life events affecting family and household: Secondary | ICD-10-CM

## 2011-03-11 DIAGNOSIS — F411 Generalized anxiety disorder: Secondary | ICD-10-CM

## 2011-03-11 DIAGNOSIS — F259 Schizoaffective disorder, unspecified: Principal | ICD-10-CM

## 2011-03-11 LAB — DIFFERENTIAL
Basophils Absolute: 0 10*3/uL (ref 0.0–0.1)
Basophils Relative: 0 % (ref 0–1)
Eosinophils Relative: 1 % (ref 0–5)
Monocytes Absolute: 1 10*3/uL (ref 0.1–1.0)
Monocytes Relative: 10 % (ref 3–12)

## 2011-03-11 LAB — URINALYSIS, ROUTINE W REFLEX MICROSCOPIC
Glucose, UA: NEGATIVE mg/dL
Hgb urine dipstick: NEGATIVE
Leukocytes, UA: NEGATIVE
Specific Gravity, Urine: 1.008 (ref 1.005–1.030)
pH: 6 (ref 5.0–8.0)

## 2011-03-11 LAB — COMPREHENSIVE METABOLIC PANEL
ALT: 13 U/L (ref 0–35)
Albumin: 4 g/dL (ref 3.5–5.2)
Alkaline Phosphatase: 130 U/L — ABNORMAL HIGH (ref 39–117)
BUN: 6 mg/dL (ref 6–23)
Calcium: 9.8 mg/dL (ref 8.4–10.5)
GFR calc Af Amer: >60 mL/min (ref >60)
Glucose, Bld: 100 mg/dL — ABNORMAL HIGH (ref 70–99)
Potassium: 4.1 mEq/L (ref 3.5–5.1)
Sodium: 132 mEq/L — ABNORMAL LOW (ref 135–145)
Total Protein: 7.7 g/dL (ref 6.0–8.3)

## 2011-03-11 LAB — ETHANOL: Alcohol, Ethyl (B): <11 mg/dL (ref 0–11)

## 2011-03-11 LAB — CBC
MCH: 31.8 pg (ref 26.0–34.0)
MCHC: 35.1 g/dL (ref 30.0–36.0)
RDW: 13.7 % (ref 11.5–15.5)

## 2011-03-11 LAB — RAPID URINE DRUG SCREEN, HOSP PERFORMED
Amphetamines: NOT DETECTED
Benzodiazepines: NOT DETECTED
Cocaine: NOT DETECTED
Opiates: NOT DETECTED
Tetrahydrocannabinol: NOT DETECTED

## 2011-03-12 DIAGNOSIS — F411 Generalized anxiety disorder: Secondary | ICD-10-CM

## 2011-03-12 DIAGNOSIS — F259 Schizoaffective disorder, unspecified: Secondary | ICD-10-CM

## 2011-03-13 NOTE — Assessment & Plan Note (Signed)
[name], [name] NO.:  [id number]  MEDICAL RECORD NO.:  [id number]  LOCATION:  0507                          FACILITY:  [facility]  PHYSICIAN:  Franchot Gallo, MD     DATE OF BIRTH:  22-Jul-1961  DATE OF ADMISSION:  03/11/2011 DATE OF DISCHARGE:                      PSYCHIATRIC ADMISSION ASSESSMENT   CHIEF COMPLAINT:  "I need help for my depression and my suicidal thoughts."  HISTORY OF PRESENT ILLNESS:  [name] is a 50 year old divorced white female who was admitted to [facility] on March 11, 2011 for evaluation and treatment of longstanding depressive symptoms as well as anxiety and suicidal ideations.  On interview the patient states that she has been episodically depressed for many years, at first feels she became depressed in the mid 1990s. She reports that over the past 3 months her depression has increasingly worsened where she is having difficulty initiating and maintaining sleep.  She does report a good appetite but describes her depression as severe with severe feelings of sadness, anhedonia and depressed mood. On a scale of 1-10 she rates her depression today as a 10 with 10 being the most severe.  The patient states she was experiencing suicidal ideations prior to admission but states that she feels safe in the hospital with no suicidal ideations reported today.  She does report one past suicide attempt by overdosing on medications in 1997.  The patient also reports severe anxiety symptoms and again describes on a scale of 1-10 with 10 being both severe her anxiety days as rated as a 10.  She denies any panic episodes.  The patient also reports a history of auditory hallucinations but denies any current auditory or visual hallucinations or delusional thinking. She also denies any past or current manic or hypomanic symptoms.  The patient reports to smoking one-pack of cigarettes per day but denies any recent use of alcohol or  illicit drugs.  The patient presents for evaluation and treatment of the above symptoms.  PAST PSYCHIATRIC HISTORY:  The patient reports at least four past psychiatric hospitalizations to [facility].  She also reports past admissions to [facility], [facility], [facility] and old [place].  She also reports to being on numerous antidepressants in the past but states that the medications Prozac did not work for her.  PAST MEDICAL HISTORY:   Current medications: 1. Protonix 40 mg p.o. q.a.m. 2. Effexor XR 75 mg p.o. q.a.m. 3. Risperdal 3 mg p.o. b.i.d. 4. BuSpar 10 mg p.o. b.i.d. 5. Tegretol 400 mg p.o. at bedtime. 6. Loratadine 10 mg p.o. q.a.m. 7. Wellbutrin 100 mg p.o. q.a.m. 8. Colace 100 mg p.o. b.i.d. 9. Hydroxyzine 25 mg p.o. q.4 h p.r.n. for anxiety. 10.Phenergan 25 mg p.o. q.4-6 hours as needed for nausea. 11.Celexa 20 mg p.o. at bedtime. 12.Klonopin 1 mg p.o. at bedtime. 13.Haldol 5 mg p.o. b.i.d.Marland Kitchen 14.Cogentin 1 mg p.o. b.i.d.  Allergies: 1. Penicillin results in rashes.  Medical illnesses: 1. Gastroesophageal reflux disease.  Past operations: 1. Tubal ligation.  FAMILY HISTORY:  The patient states that she has a brother who has been diagnosed with bipolar disorder as well as an alcoholic.  She denies any other family history of psychiatric or substance related illnesses.  SOCIAL HISTORY:  The patient states that she was born in [place], [place] with her father being in the [employer].  She states that she moved to the [place], [place] area when she was 50 years of age.  She currently lives in assisted living facility by the name of [facility] where she has resided for the last 3 months.  She states however, she is very unhappy in this facility and would like to consider moving to a different assisted-living facility.  The patient is currently divorced and was married on one occasion for 25  years.  She has one son 60 years of age who is in good health with whom the patient has very little contact.  The patient is unemployed and receives Social Security disability.  She does state that she completed high school and 1 year technical school.  She reports to smoking one-pack of cigarettes per day and denies any recent use of alcohol or illicit drugs.  MENTAL STATUS EXAM:  General-the patient was alert and oriented x3 but appeared significantly depressed.  Speech was appropriate in terms of rate and volume.  Mood appeared severely depressed.  Affect was essentially flat.  Thoughts-the patient denied any current auditory or visual hallucinations or delusional thinking.  She also denied any current suicidal ideation, but does state that these occur prior to admission.  She denies any homicidal ideations.  Judgment and insight today both appeared fair to good.  IMPRESSION:   Axis I- 1. Schizoaffective disorder-depressed type-currently under poor     control. 2. Generalized anxiety disorder-currently under poor control. 3. Nicotine dependence. Axis II:  Deferred. Axis III:  Please see past medical history above. Axis IV:  Limited primary support system.  Dissatisfaction with current housing.  Serious chronic mental illness.  Financial constraints. Axis V:  GAF at time of admission approximately 35.  Highest GAF in past year approximately 60.  PLAN: 1. The patient was continued on medication Celexa at 40 mg p.o. q.a.m.     to address her depressive symptoms. 2. The patient was restarted on the medication Risperdal 3 mg p.o.     b.i.d. to address her history of psychosis. 3. The patient was continued on the medication Tegretol at the     increased dose of 600 mg p.o. at bedtime to provide mood     stabilization. 4. The patient was continued on the medication BuSpar 10 mg p.o.     q.a.m. for anxiety. 5. The patient was continued on the medication Klonopin but at the      dosage of 0.5 mg p.o. b.i.d. for anxiety. 6. The patient will be started on Lunesta 2 mg p.o. at bedtime for     sleep. 7. The patient will continue on the medication Protonix 40 mg p.o.     q.a.m. for GE reflux disease. 8. The patient will continue on the medication loratadine 10 mg p.o.     q.a.m. for allergies. 9. The patient will participate in unit and group activities per     routine. 10.The patient will continue to be closely monitored for dangerousness     to self and/or others.    __________________________________ Franchot Gallo, MD     RR/MEDQ  D:  03/12/2011  T:  03/13/2011  Job:  [id number]  Electronically Signed by Franchot Gallo MD on 03/13/2011 08:55:38 PM

## 2011-03-20 NOTE — Discharge Summary (Signed)
  [name], [name]                ACCOUNT NO.:  [id number]  MEDICAL RECORD NO.:  [id number]  LOCATION:  0507                          FACILITY:  [facility]  PHYSICIAN:  Franchot Gallo, MD     DATE OF BIRTH:  06/15/1961  DATE OF ADMISSION:  03/11/2011 DATE OF DISCHARGE:  03/15/2011                              DISCHARGE SUMMARY   REASON FOR ADMISSION:  This is a 50 year old female who was admitted for evaluation and treatment of her longstanding depressive symptoms as well as symptoms of anxiety and suicidal thoughts.  FINAL IMPRESSION:  AXIS I:  Schizoaffective disorder, depressed type; mild generalized anxiety disorder. AXIS II: Deferred. AXIS III:  GERD. AXIS IV: Housing issues, limited primary support system, chronic mental illness. AXIS V: GAF is 65.  PERTINENT LABS:  Tegretol level is at 3.2.  Alcohol level is 11.  Sodium 132, glucose of 100.  Her urine drug screen was negative.  Urinalysis was within normal limits.  SIGNIFICANT FINDINGS:  The patient was admitted to the adult milieu for safety and stabilization.  We continue with her Celexa and resumed her Risperdal.  Continued with her Tegretol, increasing the dose to 600 mg. Continued with her BuSpar, Klonopin, and had Lunesta available for sleep.  We also continued her Protonix for her GERD and loratadine for her allergies.  The patient was active in attending the discharge planning groups.  She reported dissatisfaction with living at the [facility].  She was pleasant and cooperative and denied any suicidal thoughts.  Again, she denied any suicidal or homicidal ideation.  The patient's sleep was improving.  Her appetite was good.  Having mild depressive symptoms, rating at a 5, having no suicidal or homicidal thoughts or any psychotic symptoms, rating her hopelessness a 3 on a scale of 1-10.  We increased her Lunesta to help with her sleep.  She was beginning to feel her "normal," having no  medication side effects.  On day of discharge, her sleep was fair.  Again getting back to her normal sleeping habits.  Appetite was good, having mild depressive symptoms, rating it a 3 on a scale of 1-10.  Adamantly denied any suicidal or homicidal thoughts.  Denied any auditory hallucinations or delusional thinking.  Patient reports her anxiety was mild, rating it a 3.  Denied any medication side effects.  DISCHARGE MEDICATIONS: 1. Celexa 40 mg daily. 2. Claritin 10 mg as needed. 3. Protonix 40 mg daily. 4. BuSpar 10 mg in the morning. 5. Klonopin 0.5 mg b.i.d. 6. Risperdal 3 mg b.i.d. 7. Tegretol 200 mg, taking 3 at bedtime. 8. Lunesta 3 mg nightly.  Her follow-up appointment was with [facility], and they were to see the patient at the facility.  Their phone number [phone].     Landry Corporal, N.P.   ______________________________ Franchot Gallo, MD    JO/MEDQ  D:  03/19/2011  T:  03/19/2011  Job:  [id number]  Electronically Signed by Limmie PatriciaP. on 03/20/2011 09:05:13 AM Electronically Signed by Franchot Gallo MD on 03/20/2011 04:43:15 PM

## 2011-04-16 ENCOUNTER — Emergency Department (HOSPITAL_COMMUNITY)
Admission: EM | Admit: 2011-04-16 | Discharge: 2011-04-16 | Disposition: A | Discharge status: Home / Self Care | Payer: Medicare Other | Attending: Emergency Medicine | Admitting: Emergency Medicine

## 2011-04-16 DIAGNOSIS — Z79899 Other long term (current) drug therapy: Secondary | ICD-10-CM | POA: Insufficient documentation

## 2011-04-16 DIAGNOSIS — F3130 Bipolar disorder, current episode depressed, mild or moderate severity, unspecified: Secondary | ICD-10-CM | POA: Insufficient documentation

## 2011-04-16 DIAGNOSIS — R45851 Suicidal ideations: Secondary | ICD-10-CM | POA: Insufficient documentation

## 2011-04-16 LAB — COMPREHENSIVE METABOLIC PANEL
AST: 13 U/L (ref 0–37)
Albumin: 3.5 g/dL (ref 3.5–5.2)
BUN: 10 mg/dL (ref 6–23)
Calcium: 9.2 mg/dL (ref 8.4–10.5)
Chloride: 96 mEq/L (ref 96–112)
Creatinine, Ser: 0.68 mg/dL (ref 0.50–1.10)
Total Bilirubin: 0.1 mg/dL — ABNORMAL LOW (ref 0.3–1.2)
Total Protein: 7.1 g/dL (ref 6.0–8.3)

## 2011-04-16 LAB — RAPID URINE DRUG SCREEN, HOSP PERFORMED
Amphetamines: NOT DETECTED
Barbiturates: NOT DETECTED
Benzodiazepines: NOT DETECTED
Cocaine: NOT DETECTED
Opiates: NOT DETECTED
Tetrahydrocannabinol: NOT DETECTED

## 2011-04-16 LAB — DIFFERENTIAL
Basophils Absolute: 0 10*3/uL (ref 0.0–0.1)
Basophils Relative: 0 % (ref 0–1)
Lymphocytes Relative: 22 % (ref 12–46)
Monocytes Absolute: 0.9 10*3/uL (ref 0.1–1.0)
Neutro Abs: 5.8 10*3/uL (ref 1.7–7.7)
Neutrophils Relative %: 66 % (ref 43–77)

## 2011-04-16 LAB — ETHANOL: Alcohol, Ethyl (B): <11 mg/dL (ref 0–11)

## 2011-04-16 LAB — CBC
HCT: 44 % (ref 36.0–46.0)
Hemoglobin: 15 g/dL (ref 12.0–15.0)
RBC: 4.79 MIL/uL (ref 3.87–5.11)

## 2011-05-31 ENCOUNTER — Encounter: Payer: Self-pay | Admitting: Emergency Medicine

## 2011-05-31 ENCOUNTER — Emergency Department (HOSPITAL_COMMUNITY)
Admission: EM | Admit: 2011-05-31 | Discharge: 2011-06-01 | Disposition: A | Discharge status: Home / Self Care | Payer: Medicare Other | Attending: Emergency Medicine | Admitting: Emergency Medicine

## 2011-05-31 DIAGNOSIS — F319 Bipolar disorder, unspecified: Secondary | ICD-10-CM | POA: Insufficient documentation

## 2011-05-31 DIAGNOSIS — F259 Schizoaffective disorder, unspecified: Secondary | ICD-10-CM | POA: Insufficient documentation

## 2011-05-31 DIAGNOSIS — F17200 Nicotine dependence, unspecified, uncomplicated: Secondary | ICD-10-CM | POA: Insufficient documentation

## 2011-05-31 DIAGNOSIS — Z79899 Other long term (current) drug therapy: Secondary | ICD-10-CM | POA: Insufficient documentation

## 2011-05-31 DIAGNOSIS — R45851 Suicidal ideations: Secondary | ICD-10-CM

## 2011-05-31 HISTORY — DX: Gastro-esophageal reflux disease without esophagitis: K21.9

## 2011-05-31 HISTORY — DX: Bipolar disorder, unspecified: F31.9

## 2011-05-31 LAB — ETHANOL: Alcohol, Ethyl (B): <11 mg/dL (ref 0–11)

## 2011-05-31 LAB — COMPREHENSIVE METABOLIC PANEL
ALT: 13 U/L (ref 0–35)
AST: 14 U/L (ref 0–37)
Calcium: 9.8 mg/dL (ref 8.4–10.5)
Sodium: 133 mEq/L — ABNORMAL LOW (ref 135–145)
Total Protein: 7.6 g/dL (ref 6.0–8.3)

## 2011-05-31 LAB — CBC
MCH: 32.1 pg (ref 26.0–34.0)
MCHC: 35 g/dL (ref 30.0–36.0)
Platelets: 292 10*3/uL (ref 150–400)
RBC: 4.89 MIL/uL (ref 3.87–5.11)

## 2011-05-31 LAB — RAPID URINE DRUG SCREEN, HOSP PERFORMED
Amphetamines: NOT DETECTED
Barbiturates: NOT DETECTED
Benzodiazepines: NOT DETECTED
Tetrahydrocannabinol: NOT DETECTED

## 2011-05-31 MED ORDER — ONDANSETRON HCL 4 MG PO TABS: 4.0000 mg | ORAL_TABLET | Freq: Three times a day (TID) | ORAL | Status: DC | PRN

## 2011-05-31 MED ORDER — NICOTINE 21 MG/24HR TD PT24
21.0000 mg | MEDICATED_PATCH | Freq: Every day | TRANSDERMAL | Status: DC
  Administered 2011-06-01 (×2): 21 mg via TRANSDERMAL
  Filled 2011-05-31: qty 1

## 2011-05-31 MED ORDER — ZOLPIDEM TARTRATE 5 MG PO TABS: 5.0000 mg | ORAL_TABLET | Freq: Every evening | ORAL | Status: DC | PRN

## 2011-05-31 MED ORDER — LORAZEPAM 1 MG PO TABS: 1.0000 mg | ORAL_TABLET | Freq: Three times a day (TID) | ORAL | Status: DC | PRN

## 2011-05-31 MED ORDER — IBUPROFEN 200 MG PO TABS: 600.0000 mg | ORAL_TABLET | Freq: Three times a day (TID) | ORAL | Status: DC | PRN

## 2011-05-31 MED ORDER — ACETAMINOPHEN 325 MG PO TABS: 650.0000 mg | ORAL_TABLET | ORAL | Status: DC | PRN

## 2011-05-31 MED ORDER — ALUM & MAG HYDROXIDE-SIMETH 200-200-20 MG/5ML PO SUSP: 30.0000 mL | ORAL | Status: DC | PRN

## 2011-05-31 NOTE — ED Provider Notes (Signed)
History     CSN: [id number] Arrival date & time: 05/31/2011  5:48 PM   First MD Initiated Contact with Patient 05/31/11 2129      Chief Complaint  Patient presents with  . Psychiatric Evaluation    (Consider location/radiation/quality/duration/timing/severity/associated sxs/prior treatment) HPI  The patient has been seen in the emergency apartment numerous times for psychiatric complaints of suicidal ideation returns to emergency department this evening complaining of recurrent thoughts of harming self and suicidal ideation that began again yesterday. patient states she has a Child psychotherapist who comes to her home twice a week and despite discussing feelings with her was scared that she might act upon such thoughts of harm to herself. Patient has no physical complaints at this time. States thoughts were gradual onset and have been worsening. Denies homicidal ideation or hallucinations. Denies aggravating or relieving factors.  Past Medical History  Diagnosis Date  . Gastro - esophageal reflux disease   . Schizoaffective disorder   . Bipolar mood disorder     Past Surgical History  Procedure Date  . Tubal ligation     No family history on file.  History  Substance Use Topics  . Smoking status: Current Everyday Smoker -- 1.0 packs/day    Types: Cigarettes  . Smokeless tobacco: Not on file  . Alcohol Use: No    OB History    Grav Para Term Preterm Abortions TAB SAB Ect Mult Living                  Review of Systems  All other systems reviewed and are negative.    Allergies  Penicillins  Home Medications   Current Outpatient Rx  Name Route Sig Dispense Refill  . BUSPIRONE HCL 10 MG PO TABS Oral Take 10 mg by mouth daily.     Marland Kitchen CARBAMAZEPINE 200 MG PO TABS Oral Take 600 mg by mouth at bedtime.     Marland Kitchen CITALOPRAM HYDROBROMIDE 40 MG PO TABS Oral Take 40 mg by mouth daily.      Marland Kitchen CLONAZEPAM 0.5 MG PO TABS Oral Take 0.5 mg by mouth 2 (two) times daily as needed. For  anxiety.    . DOCUSATE SODIUM 100 MG PO CAPS Oral Take 100 mg by mouth 2 (two) times daily.      Marland Kitchen ESZOPICLONE 3 MG PO TABS Oral Take 3 mg by mouth at bedtime.      Marland Kitchen LORATADINE 10 MG PO TABS Oral Take 10 mg by mouth daily.      Marland Kitchen OMEPRAZOLE 20 MG PO CPDR Oral Take 20 mg by mouth daily.      Marland Kitchen RISPERIDONE 3 MG PO TABS Oral Take 3 mg by mouth 2 (two) times daily.        BP 126/53  Pulse 74  Temp(Src) 98.2 F (36.8 C) (Oral)  Resp 20  SpO2 95%  Physical Exam  Nursing note and vitals reviewed. Constitutional: She is oriented to person, place, and time. She appears well-developed and well-nourished. No distress.  HENT:  Head: Normocephalic and atraumatic.  Eyes: Conjunctivae are normal.  Neck: Normal range of motion. Neck supple.  Cardiovascular: Normal rate, regular rhythm, normal heart sounds and intact distal pulses.  Exam reveals no gallop and no friction rub.   No murmur heard. Pulmonary/Chest: Effort normal and breath sounds normal. No respiratory distress. She has no wheezes. She has no rales. She exhibits no tenderness.  Abdominal: Bowel sounds are normal. She exhibits no distension and no mass. There  is no tenderness. There is no rebound and no guarding.  Musculoskeletal: Normal range of motion. She exhibits no edema and no tenderness.  Neurological: She is alert and oriented to person, place, and time.  Skin: Skin is warm and dry. No rash noted. She is not diaphoretic. No erythema.  Psychiatric: She has a normal mood and affect. Her behavior is normal.    ED Course  Procedures (including critical care time)   Holding orders written the patient be placed in slight ED for suicidal ideation. Patient voices understanding of cycling and is agreeable to staying for evaluation of act team for inpatient placement. Labs Reviewed  CBC - Abnormal; Notable for the following:    Hemoglobin 15.7 (*)    All other components within normal limits  COMPREHENSIVE METABOLIC PANEL -  Abnormal; Notable for the following:    Sodium 133 (*)    Chloride 95 (*)    Total Bilirubin 0.1 (*)    All other components within normal limits  URINE RAPID DRUG SCREEN (HOSP PERFORMED)  ETHANOL   No results found.   1. Suicidal ideation       MDM   Signout given to Dr. Norlene Campbell patient patient is pending a ACT evaluation for disposition       Jenness Corner, PA 06/01/11 [phone]

## 2011-05-31 NOTE — ED Notes (Signed)
Pt presenting to ed with c/o suicidal ideation onset today pt states she's from [facility] and her act team member dropped her off. Pt states no plan she's just been thinking about it.

## 2011-06-01 MED ORDER — NICOTINE 21 MG/24HR TD PT24
MEDICATED_PATCH | TRANSDERMAL | Status: AC
  Administered 2011-06-01: 21 mg via TRANSDERMAL
  Filled 2011-06-01: qty 1

## 2011-06-01 NOTE — ED Notes (Signed)
Pt. breating wnl, pleasant and cooperative.  Offered shower to pt., pt. Declined, informed pt. To let us know when she is ready, pt. Verbalized understanding.

## 2011-06-01 NOTE — ED Notes (Signed)
Pt served breakfast tray. Pt has eaten and is resting quietly at this time

## 2011-06-01 NOTE — ED Notes (Signed)
Skin color good, resp. Even & unlabored.  No distress noted.
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
## 2011-06-01 NOTE — ED Notes (Signed)
Pt ate approx 25% of breakfast

## 2011-06-01 NOTE — ED Notes (Signed)
Assumed care of pt from night shift

## 2011-06-01 NOTE — ED Provider Notes (Addendum)
Medical screening examination/treatment/procedure(s) were performed by non-physician practitioner and as supervising physician I was immediately available for consultation/collaboration.  Olivia Mackie, MD 06/01/11 0729  Pt stable overnight, awaiting ACT assessment/plan  Olivia Mackie, MD 06/01/11 0730

## 2011-06-01 NOTE — ED Notes (Signed)
Pt ate approx 10% of lunch tray

## 2011-06-01 NOTE — Progress Notes (Signed)
Patient was assessed by telepsych who recommends discharge. EDP notified who agrees with disposition. This Clinical research associate contacted [employer] who advised they will come and pick up patient, however, did not give a time.  Patient's nurse notified. Patient did not express SI/HI/AVH.  Ileene Hutchinson , MSW, LCSWA 06/01/2011 1:24 PM

## 2011-07-02 ENCOUNTER — Other Ambulatory Visit: Payer: Self-pay

## 2011-07-02 ENCOUNTER — Emergency Department (HOSPITAL_COMMUNITY): Payer: Medicare Other

## 2011-07-02 ENCOUNTER — Emergency Department (HOSPITAL_COMMUNITY)
Admission: EM | Admit: 2011-07-02 | Discharge: 2011-07-02 | Disposition: A | Discharge status: Other Acute Inpatient Hospital | Payer: Medicare Other | Attending: Emergency Medicine | Admitting: Emergency Medicine

## 2011-07-02 ENCOUNTER — Encounter (HOSPITAL_COMMUNITY): Payer: Self-pay | Admitting: *Deleted

## 2011-07-02 DIAGNOSIS — F319 Bipolar disorder, unspecified: Secondary | ICD-10-CM | POA: Insufficient documentation

## 2011-07-02 DIAGNOSIS — R079 Chest pain, unspecified: Secondary | ICD-10-CM | POA: Insufficient documentation

## 2011-07-02 DIAGNOSIS — K219 Gastro-esophageal reflux disease without esophagitis: Secondary | ICD-10-CM | POA: Insufficient documentation

## 2011-07-02 DIAGNOSIS — Z79899 Other long term (current) drug therapy: Secondary | ICD-10-CM | POA: Insufficient documentation

## 2011-07-02 HISTORY — DX: Bipolar disorder, unspecified: F31.9

## 2011-07-02 LAB — CBC
HCT: 42.1 % (ref 36.0–46.0)
Hemoglobin: 14.7 g/dL (ref 12.0–15.0)
MCH: 32 pg (ref 26.0–34.0)
MCHC: 34.9 g/dL (ref 30.0–36.0)
MCV: 91.5 fL (ref 78.0–100.0)
Platelets: 253 10*3/uL (ref 150–400)
RBC: 4.6 MIL/uL (ref 3.87–5.11)
RDW: 13.8 % (ref 11.5–15.5)
WBC: 8.5 10*3/uL (ref 4.0–10.5)

## 2011-07-02 LAB — BASIC METABOLIC PANEL
Calcium: 9 mg/dL (ref 8.4–10.5)
Creatinine, Ser: 0.64 mg/dL (ref 0.50–1.10)
GFR calc Af Amer: >90 mL/min (ref >90)

## 2011-07-02 LAB — POCT I-STAT TROPONIN I: Troponin i, poc: 0.01 ng/mL (ref 0.00–0.08)

## 2011-07-02 MED ORDER — GI COCKTAIL ~~LOC~~
30.0000 mL | Freq: Once | ORAL | Status: AC
  Administered 2011-07-02: 30 mL via ORAL
  Filled 2011-07-02: qty 30

## 2011-07-02 MED ORDER — HYDROCODONE-ACETAMINOPHEN 5-325 MG PO TABS
1.0000 | ORAL_TABLET | Freq: Once | ORAL | Status: AC
  Administered 2011-07-02: 1 via ORAL
  Filled 2011-07-02: qty 1

## 2011-07-02 MED ORDER — FAMOTIDINE 20 MG PO TABS
20.0000 mg | ORAL_TABLET | Freq: Once | ORAL | Status: AC
  Administered 2011-07-02: 20 mg via ORAL
  Filled 2011-07-02: qty 1

## 2011-07-02 NOTE — ED Provider Notes (Signed)
History     CSN: [id number] Arrival date & time: 07/02/2011  2:28 PM   First MD Initiated Contact with Patient 07/02/11 1437      Chief Complaint  Patient presents with  . Chest Pain    (Consider location/radiation/quality/duration/timing/severity/associated sxs/prior treatment) Patient is a 50 y.o. female presenting with chest pain. The history is provided by the patient.  Chest Pain Pertinent negatives for primary symptoms include no fever, no shortness of breath, no cough, no palpitations and no abdominal pain.   pt c/o mid chest pain in past day. Constant. Dull. Non radiating. No specific exacerbating or alleviating factors. No hx cad. Says father had 'heart trouble' denies fam hx premature cad. Pain is not pleuritic. Located midline. No leg pain or swelling. No dvt or pe hx. Pt notes hx gerd, similar symptoms. w pain, no assoc nv, sob or diaphoresis. No cough or uri c/o. No fever or chills. No exertional cp or discomfort. No unusual fatigue or doe.  Past Medical History  Diagnosis Date  . Gastro - esophageal reflux disease   . Schizoaffective disorder   . Bipolar mood disorder   . Bipolar affective     complient with meds    Past Surgical History  Procedure Date  . Tubal ligation     History reviewed. No pertinent family history.  History  Substance Use Topics  . Smoking status: Current Everyday Smoker -- 1.0 packs/day    Types: Cigarettes  . Smokeless tobacco: Not on file  . Alcohol Use: No    OB History    Grav Para Term Preterm Abortions TAB SAB Ect Mult Living                  Review of Systems  Constitutional: Negative for fever and chills.  HENT: Negative for neck pain.   Eyes: Negative for redness.  Respiratory: Negative for cough and shortness of breath.   Cardiovascular: Positive for chest pain. Negative for palpitations and leg swelling.  Gastrointestinal: Negative for abdominal pain.  Genitourinary: Negative for flank pain.  Musculoskeletal:  Negative for back pain.  Skin: Negative for rash.  Neurological: Negative for headaches.  Hematological: Does not bruise/bleed easily.  Psychiatric/Behavioral: Negative for confusion.    Allergies  Penicillins  Home Medications   Current Outpatient Rx  Name Route Sig Dispense Refill  . BUSPIRONE HCL 10 MG PO TABS Oral Take 10 mg by mouth daily.     Marland Kitchen CARBAMAZEPINE 200 MG PO TABS Oral Take 600 mg by mouth at bedtime.     Marland Kitchen CITALOPRAM HYDROBROMIDE 40 MG PO TABS Oral Take 40 mg by mouth daily.      Marland Kitchen CLONAZEPAM 0.5 MG PO TABS Oral Take 0.5 mg by mouth 2 (two) times daily. For anxiety.    . DOCUSATE SODIUM 100 MG PO CAPS Oral Take 100 mg by mouth 2 (two) times daily.      Marland Kitchen ESZOPICLONE 3 MG PO TABS Oral Take 3 mg by mouth at bedtime.      Marland Kitchen LORATADINE 10 MG PO TABS Oral Take 10 mg by mouth daily.      Marland Kitchen OMEPRAZOLE 20 MG PO CPDR Oral Take 20 mg by mouth daily.      Marland Kitchen RISPERIDONE 3 MG PO TABS Oral Take 3 mg by mouth 2 (two) times daily.        BP 102/65  Pulse 66  Temp(Src) 98.8 F (37.1 C) (Oral)  Resp 22  SpO2 97%  Physical Exam  Nursing note and vitals reviewed. Constitutional: She is oriented to person, place, and time. She appears well-developed and well-nourished. No distress.  Eyes: Conjunctivae are normal. No scleral icterus.  Neck: Neck supple. No tracheal deviation present.  Cardiovascular: Normal rate, regular rhythm, normal heart sounds and intact distal pulses.  Exam reveals no gallop and no friction rub.   No murmur heard. Pulmonary/Chest: Effort normal and breath sounds normal. No respiratory distress. She has no rales. She exhibits tenderness.  Abdominal: Soft. Normal appearance and bowel sounds are normal. She exhibits no distension. There is no tenderness.  Musculoskeletal: She exhibits no edema and no tenderness.  Neurological: She is alert and oriented to person, place, and time.  Skin: Skin is warm and dry. No rash noted.  Psychiatric: She has a normal mood  and affect.    ED Course  Procedures (including critical care time) Results for orders placed during the hospital encounter of 07/02/11  BASIC METABOLIC PANEL      Component Value Range   Sodium 135  135 - 145 (mEq/L)   Potassium 4.1  3.5 - 5.1 (mEq/L)   Chloride 101  96 - 112 (mEq/L)   CO2 25  19 - 32 (mEq/L)   Glucose, Bld 95  70 - 99 (mg/dL)   BUN 7  6 - 23 (mg/dL)   Creatinine, Ser 6.96  0.50 - 1.10 (mg/dL)   Calcium 9.0  8.4 - 29.5 (mg/dL)   GFR calc non Af Amer >90  >90 (mL/min)   GFR calc Af Amer >90  >90 (mL/min)  CBC      Component Value Range   WBC 8.5  4.0 - 10.5 (K/uL)   RBC 4.60  3.87 - 5.11 (MIL/uL)   Hemoglobin 14.7  12.0 - 15.0 (g/dL)   HCT 28.4  13.2 - 44.0 (%)   MCV 91.5  78.0 - 100.0 (fL)   MCH 32.0  26.0 - 34.0 (pg)   MCHC 34.9  30.0 - 36.0 (g/dL)   RDW 10.2  72.5 - 36.6 (%)   Platelets 253  150 - 400 (K/uL)  POCT I-STAT TROPONIN I      Component Value Range   Troponin i, poc 0.01  0.00 - 0.08 (ng/mL)   Comment 3            Dg Chest 2 View  07/02/2011  *RADIOLOGY REPORT*  Clinical Data: Mid chest pain.  Smoker.  CHEST - 2 VIEW  Comparison: None.  Findings: Heart size is upper normal.  Mediastinal and hilar contours are normal.  Lung volumes are slightly low.  The lungs are clear.  No focal airspace disease, edema, pleural effusion, or pneumothorax.  There are several healed upper right rib fracture deformities.  No acute bony abnormalities identified.  IMPRESSION: Borderline cardiomegaly.  No acute cardiopulmonary disease.  Original Report Authenticated By: Britta Mccreedy, M.D.        MDM  Ecg. Labs.   Cxr. Reviewed prior charts. Nursing notes reviewed.   Date: 07/02/2011  Rate: 65  Rhythm: normal sinus rhythm  QRS Axis: normal  Intervals: normal  ST/T Wave abnormalities: normal  Conduction Disutrbances:none  Narrative Interpretation:   Old EKG Reviewed: unchanged   After constant symptoms x 24+hours, trop normal.   Recheck relief w gi  med,vicodin in ed.  Recheck ambulatory about the ED, no cp or sob.        Suzi Roots, MD 07/02/11 [phone]

## 2011-07-02 NOTE — ED Notes (Signed)
Spoke with [employer] and ordered a regular meal tray

## 2011-07-02 NOTE — ED Notes (Signed)
EKGs handed to [place], MD

## 2011-07-02 NOTE — ED Notes (Signed)
PT reports new onset of CP , PT denies any radiation . Pt does report increased pain in chest with movement.

## 2011-08-13 ENCOUNTER — Encounter (HOSPITAL_COMMUNITY): Payer: Self-pay | Admitting: Emergency Medicine

## 2011-08-13 ENCOUNTER — Emergency Department (HOSPITAL_COMMUNITY): Payer: Medicare Other

## 2011-08-13 ENCOUNTER — Other Ambulatory Visit: Payer: Self-pay

## 2011-08-13 ENCOUNTER — Observation Stay (HOSPITAL_COMMUNITY)
Admission: EM | Admit: 2011-08-13 | Discharge: 2011-08-14 | Disposition: A | Discharge status: Other Acute Inpatient Hospital | Payer: Medicare Other | Attending: Emergency Medicine | Admitting: Emergency Medicine

## 2011-08-13 DIAGNOSIS — F259 Schizoaffective disorder, unspecified: Secondary | ICD-10-CM | POA: Insufficient documentation

## 2011-08-13 DIAGNOSIS — K219 Gastro-esophageal reflux disease without esophagitis: Secondary | ICD-10-CM | POA: Insufficient documentation

## 2011-08-13 DIAGNOSIS — F319 Bipolar disorder, unspecified: Secondary | ICD-10-CM | POA: Insufficient documentation

## 2011-08-13 DIAGNOSIS — R079 Chest pain, unspecified: Principal | ICD-10-CM | POA: Insufficient documentation

## 2011-08-13 DIAGNOSIS — M79609 Pain in unspecified limb: Secondary | ICD-10-CM | POA: Insufficient documentation

## 2011-08-13 LAB — COMPREHENSIVE METABOLIC PANEL
BUN: 7 mg/dL (ref 6–23)
CO2: 25 mEq/L (ref 19–32)
Chloride: 102 mEq/L (ref 96–112)
Creatinine, Ser: 0.7 mg/dL (ref 0.50–1.10)
GFR calc Af Amer: >90 mL/min (ref >90)
GFR calc non Af Amer: >90 mL/min (ref >90)
Glucose, Bld: 91 mg/dL (ref 70–99)
Total Bilirubin: 0.1 mg/dL — ABNORMAL LOW (ref 0.3–1.2)

## 2011-08-13 LAB — CBC
HCT: 41 % (ref 36.0–46.0)
Hemoglobin: 13.9 g/dL (ref 12.0–15.0)
MCV: 93.6 fL (ref 78.0–100.0)
RBC: 4.38 MIL/uL (ref 3.87–5.11)
RDW: 13.9 % (ref 11.5–15.5)
WBC: 9.6 10*3/uL (ref 4.0–10.5)

## 2011-08-13 LAB — POCT I-STAT TROPONIN I: Troponin i, poc: 0 ng/mL (ref 0.00–0.08)

## 2011-08-13 MED ORDER — METOPROLOL TARTRATE 25 MG PO TABS: 100.0000 mg | ORAL_TABLET | Freq: Once | ORAL | Status: DC

## 2011-08-13 MED ORDER — GI COCKTAIL ~~LOC~~
30.0000 mL | Freq: Once | ORAL | Status: AC
  Administered 2011-08-13: 30 mL via ORAL
  Filled 2011-08-13: qty 30

## 2011-08-13 MED ORDER — METOPROLOL TARTRATE 25 MG PO TABS: 50.0000 mg | ORAL_TABLET | Freq: Once | ORAL | Status: DC

## 2011-08-13 MED ORDER — SODIUM CHLORIDE 0.9 % IV SOLN
20.0000 mL | INTRAVENOUS | Status: DC
  Administered 2011-08-13: 1000 mL via INTRAVENOUS
  Administered 2011-08-14: 20 mL via INTRAVENOUS

## 2011-08-13 MED ORDER — MORPHINE SULFATE 4 MG/ML IJ SOLN
4.0000 mg | Freq: Once | INTRAMUSCULAR | Status: AC
  Administered 2011-08-13: 4 mg via INTRAVENOUS
  Filled 2011-08-13: qty 1

## 2011-08-13 MED ORDER — SODIUM CHLORIDE 0.9 % IV BOLUS (SEPSIS)
500.0000 mL | Freq: Once | INTRAVENOUS | Status: AC
  Administered 2011-08-13: 500 mL via INTRAVENOUS

## 2011-08-13 MED ORDER — NITROGLYCERIN 2 % TD OINT: 1.0000 [in_us] | TOPICAL_OINTMENT | Freq: Once | TRANSDERMAL | Status: DC

## 2011-08-13 MED ORDER — ASPIRIN EC 325 MG PO TBEC
325.0000 mg | DELAYED_RELEASE_TABLET | Freq: Every day | ORAL | Status: DC
  Administered 2011-08-13: 325 mg via ORAL
  Filled 2011-08-13: qty 1

## 2011-08-13 NOTE — ED Notes (Signed)
Patient transported to CDU#1via wheelchair with all personal belongings. Report given to Magda Paganini, Charity fundraiser.

## 2011-08-13 NOTE — ED Provider Notes (Signed)
History     CSN: [id number]  Arrival date & time 08/13/11  1759   First MD Initiated Contact with Patient 08/13/11 1816      Chief Complaint  Patient presents with  . Chest Pain    (Consider location/radiation/quality/duration/timing/severity/associated sxs/prior treatment) HPI Patient presents emergency room with complaints of sharp chest pain that started today.  It started about an hour ago while she was lying down. The pain is in the center of her chest as well as the right side. She has some discomfort in her right arm as well. She has not had any coughing or shortness of breath. She hasn't had any diaphoresis. She has not had any problems with abdominal pain or diarrhea. Patient had a similar episode back in December. She was seen in the emergency department and had a negative workup. She has not seen anyone else for this. She does not have known history of heart problems or lung problems. She has not been on any recent trips or travel. Past Medical History  Diagnosis Date  . Gastro - esophageal reflux disease   . Schizoaffective disorder   . Bipolar mood disorder   . Bipolar affective     complient with meds    Past Surgical History  Procedure Date  . Tubal ligation     No family history on file.  History  Substance Use Topics  . Smoking status: Current Everyday Smoker -- 1.0 packs/day    Types: Cigarettes  . Smokeless tobacco: Not on file  . Alcohol Use: No    OB History    Grav Para Term Preterm Abortions TAB SAB Ect Mult Living                  Review of Systems  All other systems reviewed and are negative.    Allergies  Penicillins  Home Medications   Current Outpatient Rx  Name Route Sig Dispense Refill  . BUSPIRONE HCL 10 MG PO TABS Oral Take 10 mg by mouth daily.     Marland Kitchen CARBAMAZEPINE 200 MG PO TABS Oral Take 600 mg by mouth at bedtime.     Marland Kitchen CITALOPRAM HYDROBROMIDE 40 MG PO TABS Oral Take 40 mg by mouth daily.      Marland Kitchen CLONAZEPAM 0.5 MG PO TABS  Oral Take 0.5 mg by mouth 2 (two) times daily. For anxiety.    . DOCUSATE SODIUM 100 MG PO CAPS Oral Take 100 mg by mouth 2 (two) times daily.      Marland Kitchen LORATADINE 10 MG PO TABS Oral Take 10 mg by mouth daily.      Marland Kitchen OMEPRAZOLE 20 MG PO CPDR Oral Take 20 mg by mouth daily.      Marland Kitchen RISPERIDONE 3 MG PO TABS Oral Take 3 mg by mouth 2 (two) times daily.      Marland Kitchen ZOLPIDEM TARTRATE 10 MG PO TABS Oral Take 10 mg by mouth at bedtime.      BP 93/66  Pulse 56  Temp(Src) 98.7 F (37.1 C) (Oral)  Resp 16  Wt 210 lb (95.255 kg)  SpO2 94%  Physical Exam  Nursing note and vitals reviewed. Constitutional: She appears well-developed and well-nourished. No distress.  HENT:  Head: Normocephalic and atraumatic.  Right Ear: External ear normal.  Left Ear: External ear normal.  Eyes: Conjunctivae are normal. Right eye exhibits no discharge. Left eye exhibits no discharge. No scleral icterus.  Neck: Neck supple. No tracheal deviation present.  Cardiovascular: Normal rate, regular rhythm  and intact distal pulses.   Pulmonary/Chest: Effort normal and breath sounds normal. No stridor. No respiratory distress. She has no wheezes. She has no rales. She exhibits no tenderness.  Abdominal: Soft. Bowel sounds are normal. She exhibits no distension. There is no tenderness. There is no rebound and no guarding.  Musculoskeletal: She exhibits no edema and no tenderness.  Neurological: She is alert. She has normal strength. No sensory deficit. Cranial nerve deficit:  no gross defecits noted. She exhibits normal muscle tone. She displays no seizure activity. Coordination normal.  Skin: Skin is warm and dry. No rash noted.  Psychiatric: She has a normal mood and affect.    ED Course  Procedures (including critical care time)  Date: 08/13/2011  Rate: 61  Rhythm: normal sinus rhythm  QRS Axis: normal  Intervals: normal  ST/T Wave abnormalities: normal  Conduction Disutrbances:none  Narrative Interpretation:   Old EKG  Reviewed: unchanged   Labs Reviewed  COMPREHENSIVE METABOLIC PANEL - Abnormal; Notable for the following:    Albumin 3.3 (*)    Total Bilirubin 0.1 (*)    All other components within normal limits  CBC  POCT I-STAT TROPONIN I  I-STAT TROPONIN I   Dg Chest 2 View  08/13/2011  *RADIOLOGY REPORT*  Clinical Data: Chest pain  CHEST - 2 VIEW  Comparison: 07/02/2011  Findings: Lateral view degraded by patient arm position.  Remote right rib fractures. Midline trachea.  Mild cardiomegaly. Mediastinal contours otherwise within normal limits.  Mildly low lung volumes. Clear lungs.  No congestive failure.  IMPRESSION: Mild cardiomegaly and low lung volumes.  No acute finding or explanation for chest pain.  Original Report Authenticated By: Consuello Bossier, M.D.     MDM  Pt states she still is having pain in her chest.  Pain is atypical however she does smoke and there is a family history of heart disease.  Will place pt on the chest pain protocol for further evaluation.  Heart score 1 for age.       Celene Kras, MD 08/13/11 2037

## 2011-08-13 NOTE — ED Provider Notes (Signed)
History     CSN: [id number]  Arrival date & time 08/13/11  1759   First MD Initiated Contact with Patient 08/13/11 1816      Chief Complaint  Patient presents with  . Chest Pain    (Consider location/radiation/quality/duration/timing/severity/associated sxs/prior treatment) HPI  Past Medical History  Diagnosis Date  . Gastro - esophageal reflux disease   . Schizoaffective disorder   . Bipolar mood disorder   . Bipolar affective     complient with meds    Past Surgical History  Procedure Date  . Tubal ligation     No family history on file.  History  Substance Use Topics  . Smoking status: Current Everyday Smoker -- 1.0 packs/day    Types: Cigarettes  . Smokeless tobacco: Not on file  . Alcohol Use: No    OB History    Grav Para Term Preterm Abortions TAB SAB Ect Mult Living                  Review of Systems  Allergies  Penicillins  Home Medications   Current Outpatient Rx  Name Route Sig Dispense Refill  . BUSPIRONE HCL 10 MG PO TABS Oral Take 10 mg by mouth daily.     Marland Kitchen CARBAMAZEPINE 200 MG PO TABS Oral Take 600 mg by mouth at bedtime.     Marland Kitchen CITALOPRAM HYDROBROMIDE 40 MG PO TABS Oral Take 40 mg by mouth daily.      Marland Kitchen CLONAZEPAM 0.5 MG PO TABS Oral Take 0.5 mg by mouth 2 (two) times daily. For anxiety.    . DOCUSATE SODIUM 100 MG PO CAPS Oral Take 100 mg by mouth 2 (two) times daily.      Marland Kitchen LORATADINE 10 MG PO TABS Oral Take 10 mg by mouth daily.      Marland Kitchen OMEPRAZOLE 20 MG PO CPDR Oral Take 20 mg by mouth daily.      Marland Kitchen RISPERIDONE 3 MG PO TABS Oral Take 3 mg by mouth 2 (two) times daily.      Marland Kitchen ZOLPIDEM TARTRATE 10 MG PO TABS Oral Take 10 mg by mouth at bedtime.      BP 107/66  Pulse 59  Temp(Src) 98.7 F (37.1 C) (Oral)  Resp 17  Wt 210 lb (95.255 kg)  SpO2 94%  Physical Exam  ED Course  Procedures (including critical care time)  Labs Reviewed  COMPREHENSIVE METABOLIC PANEL - Abnormal; Notable for the following:    Albumin 3.3 (*)    Total Bilirubin 0.1 (*)    All other components within normal limits  CBC  POCT I-STAT TROPONIN I  I-STAT TROPONIN I   Dg Chest 2 View  08/13/2011  *RADIOLOGY REPORT*  Clinical Data: Chest pain  CHEST - 2 VIEW  Comparison: 07/02/2011  Findings: Lateral view degraded by patient arm position.  Remote right rib fractures. Midline trachea.  Mild cardiomegaly. Mediastinal contours otherwise within normal limits.  Mildly low lung volumes. Clear lungs.  No congestive failure.  IMPRESSION: Mild cardiomegaly and low lung volumes.  No acute finding or explanation for chest pain.  Original Report Authenticated By: Consuello Bossier, M.D.     No diagnosis found.    MDM  2030:  Report received from Dr. Roselyn Bering. Patient will be moved to CDU for chest pain protocol.  Heart score 1. Sharp midsternal  Chest pain at rest that radiated down her R arm.  Recent pmh of the same.  1130pm.  No further chest pain.  Sbp in the 80's.  Will give her a bolus.  Morphine may be the problem. Will consider dc ntg paste if remains low.      12:45pm  Report given to Dr. Hyacinth Meeker.  Patient remains pain free. SBP in the 80's when lying on her left side.  NTG paste is off now.   Troponins negative.  HR 62.  Probably will not need lopressor in the am for her Cardiac CT.          Jethro Bastos, NP 08/14/11 [phone]

## 2011-08-13 NOTE — ED Notes (Signed)
Here with c/o chest pain that started today while pt was lying down pt states itsd sharp in nature and radiates to the down the r arm.here skin warm and dry,resp even and unlabored. Denies diaphoresis.

## 2011-08-13 NOTE — ED Notes (Signed)
Received report from Mary Ann, RN 

## 2011-08-14 ENCOUNTER — Observation Stay (HOSPITAL_COMMUNITY): Payer: Medicare Other

## 2011-08-14 ENCOUNTER — Other Ambulatory Visit: Payer: Self-pay

## 2011-08-14 LAB — POCT I-STAT TROPONIN I: Troponin i, poc: 0 ng/mL (ref 0.00–0.08)

## 2011-08-14 MED ORDER — BUSPIRONE HCL 10 MG PO TABS
10.0000 mg | ORAL_TABLET | Freq: Once | ORAL | Status: DC
  Filled 2011-08-14: qty 1

## 2011-08-14 MED ORDER — METOPROLOL TARTRATE 1 MG/ML IV SOLN
INTRAVENOUS | Status: AC
  Filled 2011-08-14: qty 15

## 2011-08-14 MED ORDER — NITROGLYCERIN 0.4 MG SL SUBL
0.4000 mg | SUBLINGUAL_TABLET | Freq: Once | SUBLINGUAL | Status: AC
  Administered 2011-08-14: 0.4 mg via SUBLINGUAL

## 2011-08-14 MED ORDER — NITROGLYCERIN 0.4 MG SL SUBL
SUBLINGUAL_TABLET | SUBLINGUAL | Status: AC
  Administered 2011-08-14: 0.4 mg via SUBLINGUAL
  Filled 2011-08-14: qty 25

## 2011-08-14 MED ORDER — IOHEXOL 350 MG/ML SOLN
80.0000 mL | Freq: Once | INTRAVENOUS | Status: AC | PRN
  Administered 2011-08-14: 80 mL via INTRAVENOUS

## 2011-08-14 MED ORDER — PANTOPRAZOLE SODIUM 40 MG PO TBEC: 40.0000 mg | DELAYED_RELEASE_TABLET | Freq: Every day | ORAL | Status: DC

## 2011-08-14 MED ORDER — METOPROLOL TARTRATE 1 MG/ML IV SOLN: 5.0000 mg | Freq: Once | INTRAVENOUS | Status: DC

## 2011-08-14 MED ORDER — CARBAMAZEPINE 200 MG PO TABS
200.0000 mg | ORAL_TABLET | Freq: Once | ORAL | Status: DC
  Filled 2011-08-14: qty 1

## 2011-08-14 MED ORDER — CITALOPRAM HYDROBROMIDE 40 MG PO TABS
40.0000 mg | ORAL_TABLET | ORAL | Status: DC
Start: 2011-08-14 — End: 2011-08-14
  Filled 2011-08-14: qty 1

## 2011-08-14 NOTE — ED Notes (Signed)
No Nitro ointment noted on pt's skin.  Pt states she does not recall any being applied on her this am.

## 2011-08-14 NOTE — ED Notes (Signed)
Patient transported to CT -for coronary CT w/RN transporting.

## 2011-08-14 NOTE — ED Notes (Signed)
Pt brought back from CT d/t CT not ready at this time.  Advised to wait approx 10 minutes.  Pt remains nad and on monitor.

## 2011-08-14 NOTE — ED Notes (Signed)
Received report from Reed Pandy, RN

## 2011-08-14 NOTE — ED Notes (Signed)
1000 administration time adjusted for Aspirin d/t given last pm at 2257.

## 2011-08-14 NOTE — ED Notes (Signed)
Report called to Lurena Joiner at [facility] [phone] and advised pt ready for transport back.  She advised she will send someone from her facility to pick her up.

## 2011-08-14 NOTE — Progress Notes (Signed)
Observation review is complete. 

## 2011-08-14 NOTE — ED Provider Notes (Signed)
51 year old female currently in CDU under the chest pain protocol. Patient has an uneventful night last night. She denies any chest pain. Her vital signs stable. She is in no acute distress, heart is with regular rate and rhythm, lungs clear to auscultation bilaterally, abdomen is soft and nontender. She is currently awaiting her cardiac CT.  10:59 AM Radiologist, Dr. Lowella Dandy, has called and notified that the patient's cardiac CT is normal. There is, however a small pulmonary nodule which an incidental finding on his evaluation. Finding was notified to the patient with recommendation for followup to ensure resolution soft nodule. Patient was understanding and agree with plan.  Fayrene Helper, PA-C 08/14/11 1106

## 2011-08-15 NOTE — ED Provider Notes (Signed)
Medical screening examination/treatment/procedure(s) were conducted as a shared visit with non-physician practitioner(s) and myself.  I personally evaluated the patient during the encounter   Rebecca Bartha R Andrae Claunch, MD 08/15/11 1125 

## 2011-08-18 ENCOUNTER — Other Ambulatory Visit: Payer: Self-pay

## 2011-08-18 ENCOUNTER — Emergency Department (HOSPITAL_COMMUNITY)
Admission: EM | Admit: 2011-08-18 | Discharge: 2011-08-18 | Payer: Medicare Other | Attending: Emergency Medicine | Admitting: Emergency Medicine

## 2011-08-18 ENCOUNTER — Encounter (HOSPITAL_COMMUNITY): Payer: Self-pay | Admitting: Emergency Medicine

## 2011-08-18 DIAGNOSIS — R079 Chest pain, unspecified: Secondary | ICD-10-CM | POA: Insufficient documentation

## 2011-08-18 DIAGNOSIS — Z79899 Other long term (current) drug therapy: Secondary | ICD-10-CM | POA: Insufficient documentation

## 2011-08-18 DIAGNOSIS — F319 Bipolar disorder, unspecified: Secondary | ICD-10-CM | POA: Insufficient documentation

## 2011-08-18 DIAGNOSIS — M79609 Pain in unspecified limb: Secondary | ICD-10-CM | POA: Insufficient documentation

## 2011-08-18 DIAGNOSIS — K219 Gastro-esophageal reflux disease without esophagitis: Secondary | ICD-10-CM | POA: Insufficient documentation

## 2011-08-18 LAB — POCT I-STAT, CHEM 8
BUN: 3 mg/dL — ABNORMAL LOW (ref 6–23)
Calcium, Ion: 1.16 mmol/L (ref 1.12–1.32)
Glucose, Bld: 83 mg/dL (ref 70–99)
TCO2: 27 mmol/L (ref 0–100)

## 2011-08-18 LAB — POCT I-STAT TROPONIN I: Troponin i, poc: 0 ng/mL (ref 0.00–0.08)

## 2011-08-18 MED ORDER — HYDROCODONE-ACETAMINOPHEN 5-325 MG PO TABS: 1.0000 | ORAL_TABLET | Freq: Four times a day (QID) | ORAL | Status: DC | PRN

## 2011-08-18 MED ORDER — MORPHINE SULFATE 4 MG/ML IJ SOLN
4.0000 mg | Freq: Once | INTRAMUSCULAR | Status: AC
  Administered 2011-08-18: 4 mg via INTRAVENOUS
  Filled 2011-08-18: qty 1

## 2011-08-18 MED ORDER — ESOMEPRAZOLE MAGNESIUM 20 MG PO CPDR: 40.0000 mg | DELAYED_RELEASE_CAPSULE | Freq: Every day | ORAL | Status: DC

## 2011-08-18 NOTE — ED Notes (Signed)
Hermelinda Medicus, PA at bedside at this time.

## 2011-08-18 NOTE — ED Provider Notes (Cosign Needed Addendum)
Complains of subxiphoid chest pain nonradiating onset 1 pm today presently asymptomatic as I examine her pain is nonradiating Dolph nonexertional no associated shortness of breath nausea or sweatiness; cardiac risk factors include smoking father had open-heart surgery in his 10s otherwise negative. Patient had negative cardiac chest CT 08/13/2011. On exam alert appropriate Glasgow Coma Score 15 lungs clear to auscultation heart regular rate and rhythm no murmurs abdomen mildly obese nontender extremities without edema Strongly doubt cardiac etiology of pain with atypical symptoms negative troponin and recent negative cardiac chest CT in this low risk patient. Encouraged to stop smoking  Doug Sou, MD 08/18/11 9604  Doug Sou, MD 08/18/11 1739  Doug Sou, MD 08/19/11 1221

## 2011-08-18 NOTE — ED Notes (Signed)
Continue to await PTAR for transport.

## 2011-08-18 NOTE — ED Provider Notes (Signed)
History     CSN: [id number]  Arrival date & time 08/18/11  1429   First MD Initiated Contact with Patient 08/18/11 1511      Chief Complaint  Patient presents with  . Chest Pain    with right arm pain    (Consider location/radiation/quality/duration/timing/severity/associated sxs/prior treatment) HPI The patient since the emergency department with midsternal chest pain that started earlier today.  He has been seen for the same this past week and placed on the CDU chest pain protocol.  She states that her workup then was negative for any cardiac disease.  She denies shortness of breath, nausea/vomiting, abdominal pain, back pain, weakness, or headache.  She states that when she was here last morphine seemed to help with the pain.  States that she has not taken anything at home for this discomfort. Past Medical History  Diagnosis Date  . Gastro - esophageal reflux disease   . Schizoaffective disorder   . Bipolar mood disorder   . Bipolar affective     complient with meds    Past Surgical History  Procedure Date  . Tubal ligation     History reviewed. No pertinent family history.  History  Substance Use Topics  . Smoking status: Current Everyday Smoker -- 1.0 packs/day    Types: Cigarettes  . Smokeless tobacco: Not on file  . Alcohol Use: No    OB History    Grav Para Term Preterm Abortions TAB SAB Ect Mult Living                  Review of Systems All pertinent positives and negatives reviewed in the history of present illness  Allergies  Penicillins  Home Medications   Current Outpatient Rx  Name Route Sig Dispense Refill  . BUSPIRONE HCL 10 MG PO TABS Oral Take 10 mg by mouth daily.     Marland Kitchen CARBAMAZEPINE 200 MG PO TABS Oral Take 600 mg by mouth at bedtime.     Marland Kitchen CITALOPRAM HYDROBROMIDE 40 MG PO TABS Oral Take 40 mg by mouth daily.      Marland Kitchen CLONAZEPAM 0.5 MG PO TABS Oral Take 0.5 mg by mouth 2 (two) times daily. For anxiety.    . DOCUSATE SODIUM 100 MG PO CAPS  Oral Take 100 mg by mouth 2 (two) times daily.      Marland Kitchen ESZOPICLONE 3 MG PO TABS Oral Take 3 mg by mouth at bedtime. Take immediately before bedtime    . LORATADINE 10 MG PO TABS Oral Take 10 mg by mouth daily.      Marland Kitchen OMEPRAZOLE 20 MG PO CPDR Oral Take 20 mg by mouth daily.      Marland Kitchen RISPERIDONE 3 MG PO TABS Oral Take 3 mg by mouth 2 (two) times daily.      Marland Kitchen ZOLPIDEM TARTRATE 10 MG PO TABS Oral Take 10 mg by mouth at bedtime.      BP 117/65  Pulse 61  Temp(Src) 98.2 F (36.8 C) (Oral)  Resp 18  Ht 5\' 4"  (1.626 m)  Wt 210 lb (95.255 kg)  BMI 36.05 kg/m2  SpO2 99%  Physical Exam  Constitutional: She is oriented to person, place, and time. She appears well-developed and well-nourished. No distress.  HENT:  Head: Normocephalic and atraumatic.  Mouth/Throat: Oropharynx is clear and moist.  Cardiovascular: Normal rate, regular rhythm and normal heart sounds.  Exam reveals no gallop and no friction rub.   No murmur heard. Pulmonary/Chest: Effort normal and breath sounds  normal. No respiratory distress. She has no wheezes. She has no rales. She exhibits no tenderness.  Neurological: She is alert and oriented to person, place, and time.  Skin: Skin is warm and dry. No rash noted.    ED Course  Procedures (including critical care time)  Labs Reviewed  POCT I-STAT, CHEM 8 - Abnormal; Notable for the following:    BUN 3 (*)    Hemoglobin 15.3 (*)    All other components within normal limits  POCT I-STAT TROPONIN I  I-STAT TROPONIN I  I-STAT, CHEM 8     5:52 PM Reassessment of the patient she is asking for oral fluids.  The pain medication helped with her discomfort.  She is in no acute distress at this time.  I feel that based on her history of present illness physical exam and testing she did not have cardiac chest pain and this is also in review of her previous visit several days ago where she had a full chest pain protocol workup that was negative.  The patient has atypical  presentation for cardiac chest pain due to the fact the pain is constant in nature and there is no shortness of breath on exertion and no radiating type symptoms.  The patient is perc negative and low risk based on Wells criteria at this point.  She is advised to return here for any worsening in her condition.  I did not do another chest x-ray based on the fact that she had recent imaging done 3 days ago.  There is no change in her condition or worsening that would indicate the need for x-ray.  She does not have any upper respiratory symptoms or fever and cough.  She has no abnormal breath sounds on exam.  She does have a history of GERD which she states causes her to have chest pain as well.     MDM   Date: 08/18/2011  Rate: 62  Rhythm: normal sinus rhythm  QRS Axis: normal  Intervals: normal  ST/T Wave abnormalities: normal  Conduction Disutrbances:none  Narrative Interpretation:   Old EKG Reviewed: unchanged  MDM Reviewed: nursing note, vitals and previous chart Reviewed previous: ECG, labs, x-ray and CT scan Interpretation: labs and ECG             Carlyle Dolly, PA-C 08/18/11 1754

## 2011-08-18 NOTE — ED Notes (Signed)
Awaiting [facility] for transport to [facility].

## 2011-08-18 NOTE — ED Notes (Signed)
Per [facility], pt had lunch and then began having substernal chest pain.  Seen here yesterday for same; dx GERD.

## 2011-08-18 NOTE — ED Provider Notes (Signed)
Medical screening examination/treatment/procedure(s) were conducted as a shared visit with non-physician practitioner(s) and myself.  I personally evaluated the patient during the encounter  Natalynn Pedone, MD 08/18/11 2343 

## 2011-08-25 ENCOUNTER — Emergency Department (HOSPITAL_COMMUNITY)
Admission: EM | Admit: 2011-08-25 | Discharge: 2011-08-25 | Disposition: A | Discharge status: Skilled Nursing Facility | Payer: Medicare Other | Attending: Emergency Medicine | Admitting: Emergency Medicine

## 2011-08-25 ENCOUNTER — Encounter (HOSPITAL_COMMUNITY): Payer: Self-pay

## 2011-08-25 ENCOUNTER — Emergency Department (HOSPITAL_COMMUNITY): Payer: Medicare Other

## 2011-08-25 DIAGNOSIS — S2239XA Fracture of one rib, unspecified side, initial encounter for closed fracture: Secondary | ICD-10-CM

## 2011-08-25 DIAGNOSIS — R296 Repeated falls: Secondary | ICD-10-CM | POA: Insufficient documentation

## 2011-08-25 DIAGNOSIS — S2249XA Multiple fractures of ribs, unspecified side, initial encounter for closed fracture: Secondary | ICD-10-CM | POA: Insufficient documentation

## 2011-08-25 DIAGNOSIS — K219 Gastro-esophageal reflux disease without esophagitis: Secondary | ICD-10-CM | POA: Insufficient documentation

## 2011-08-25 DIAGNOSIS — R079 Chest pain, unspecified: Secondary | ICD-10-CM | POA: Insufficient documentation

## 2011-08-25 DIAGNOSIS — F259 Schizoaffective disorder, unspecified: Secondary | ICD-10-CM | POA: Insufficient documentation

## 2011-08-25 DIAGNOSIS — Y9210 Unspecified residential institution as the place of occurrence of the external cause: Secondary | ICD-10-CM | POA: Insufficient documentation

## 2011-08-25 DIAGNOSIS — F319 Bipolar disorder, unspecified: Secondary | ICD-10-CM | POA: Insufficient documentation

## 2011-08-25 MED ORDER — OXYCODONE-ACETAMINOPHEN 5-325 MG PO TABS: 1.0000 | ORAL_TABLET | Freq: Four times a day (QID) | ORAL | Status: DC | PRN

## 2011-08-25 MED ORDER — OXYCODONE-ACETAMINOPHEN 5-325 MG PO TABS
1.0000 | ORAL_TABLET | Freq: Once | ORAL | Status: AC
  Administered 2011-08-25: 1 via ORAL
  Filled 2011-08-25: qty 1

## 2011-08-25 NOTE — ED Notes (Signed)
Report given to nurse at [facility] home

## 2011-08-25 NOTE — ED Notes (Addendum)
Per ems- pt fell yesterday around 2pm and hit head on dresser and hit left side on a side rail. No complaints of headache, n/v, no LOC. Pt c/o left sided rib pain. NAD noted. No SOB, but pain with inspiration. Pt from [facility].

## 2011-08-25 NOTE — ED Notes (Signed)
PTAR called for transportation  

## 2011-08-25 NOTE — ED Provider Notes (Signed)
History     CSN: [id number]  Arrival date & time 08/25/11  1502   First MD Initiated Contact with Patient 08/25/11 1503      Chief Complaint  Patient presents with  . Rib Pain     (Consider location/radiation/quality/duration/timing/severity/associated sxs/prior treatment) HPI Comments: Patient from [facility] where she lives, history of schizoaffective disorder and bipolar disorder -- presents with a chief complaint of left lateral rib cage pain after a mechanical fall yesterday. Patient states she was trying to climb over a railing and fell onto her left side and hit the back of her head. Patient denies headaches, loss of consciousness, vomiting. She denies numbness or weakness in her extremities. Patient denies being dizzy or feeling lightheaded prior to falling. She complains that the pain in her ribs is worse with deep breathing. She has taken hydrocodone without relief. No abdominal pain or bowel changes.  Patient is a 51 y.o. female presenting with fall. The history is provided by the patient.  Fall The accident occurred yesterday. The point of impact was the head (Left lateral rib cage). Pain location: Left lateral chest. The pain is at a severity of 10/10. She was ambulatory at the scene. Pertinent negatives include no visual change, no numbness, no abdominal pain, no nausea, no vomiting, no headaches, no hearing loss and no loss of consciousness. Exacerbated by: Deep breathing.    Past Medical History  Diagnosis Date  . Gastro - esophageal reflux disease   . Schizoaffective disorder   . Bipolar mood disorder   . Bipolar affective     complient with meds    Past Surgical History  Procedure Date  . Tubal ligation     History reviewed. No pertinent family history.  History  Substance Use Topics  . Smoking status: Current Everyday Smoker -- 1.0 packs/day    Types: Cigarettes  . Smokeless tobacco: Not on file  . Alcohol Use: No    OB History    Grav Para Term  Preterm Abortions TAB SAB Ect Mult Living                  Review of Systems  Constitutional: Negative for activity change and fatigue.  HENT: Negative for neck pain and tinnitus.   Eyes: Negative for photophobia, pain and visual disturbance.  Respiratory: Negative for shortness of breath.   Cardiovascular: Negative for chest pain.  Gastrointestinal: Negative for nausea, vomiting and abdominal pain.  Musculoskeletal: Positive for arthralgias. Negative for back pain, joint swelling and gait problem.  Skin: Negative for wound.  Neurological: Negative for dizziness, loss of consciousness, weakness, light-headedness, numbness and headaches.  Psychiatric/Behavioral: Negative for confusion and decreased concentration.    Allergies  Penicillins  Home Medications   Current Outpatient Rx  Name Route Sig Dispense Refill  . BUSPIRONE HCL 10 MG PO TABS Oral Take 10 mg by mouth daily.     Marland Kitchen CARBAMAZEPINE 200 MG PO TABS Oral Take 600 mg by mouth at bedtime.     Marland Kitchen CITALOPRAM HYDROBROMIDE 40 MG PO TABS Oral Take 40 mg by mouth daily.      Marland Kitchen CLONAZEPAM 0.5 MG PO TABS Oral Take 0.5 mg by mouth 2 (two) times daily. For anxiety.    . DOCUSATE SODIUM 100 MG PO CAPS Oral Take 100 mg by mouth 2 (two) times daily.      Marland Kitchen ESOMEPRAZOLE MAGNESIUM 20 MG PO CPDR Oral Take 2 capsules (40 mg total) by mouth daily. 14 capsule 0  .  ESZOPICLONE 3 MG PO TABS Oral Take 3 mg by mouth at bedtime. Take immediately before bedtime    . HYDROCODONE-ACETAMINOPHEN 5-325 MG PO TABS Oral Take 1 tablet by mouth every 6 (six) hours as needed for pain. 10 tablet 0  . LORATADINE 10 MG PO TABS Oral Take 10 mg by mouth daily.      Marland Kitchen OMEPRAZOLE 20 MG PO CPDR Oral Take 20 mg by mouth daily.      Marland Kitchen RISPERIDONE 3 MG PO TABS Oral Take 3 mg by mouth 2 (two) times daily.      Marland Kitchen ZOLPIDEM TARTRATE 10 MG PO TABS Oral Take 10 mg by mouth at bedtime.      BP 105/70  Pulse 60  Temp(Src) 98.7 F (37.1 C) (Oral)  Resp 20  SpO2  96%  Physical Exam  Nursing note and vitals reviewed. Constitutional: She is oriented to person, place, and time. She appears well-developed and well-nourished.  HENT:  Head: Normocephalic and atraumatic.  Right Ear: External ear normal.  Left Ear: External ear normal.  Nose: Nose normal.  Mouth/Throat: Oropharynx is clear and moist.       No scalp tenderness or contusions noted.  Eyes: Pupils are equal, round, and reactive to light.  Neck: Normal range of motion. Neck supple.  Cardiovascular: Exam reveals no decreased pulses.   Pulmonary/Chest: Effort normal and breath sounds normal. No respiratory distress. She exhibits tenderness.       Left lateral inferior rib tenderness to palpation. There is no overlying ecchymosis or contusion. There is no palpable deformity. No flail chest. Patient is not in any respiratory distress.  Abdominal: Soft. There is no tenderness.  Musculoskeletal: She exhibits no edema.       Cervical back: She exhibits no tenderness.       Thoracic back: She exhibits no tenderness.       Lumbar back: She exhibits no tenderness.  Neurological: She is alert and oriented to person, place, and time. No sensory deficit. Gait normal. GCS eye subscore is 4. GCS verbal subscore is 5. GCS motor subscore is 6.  Skin: Skin is warm and dry.  Psychiatric: She has a normal mood and affect.    ED Course  Procedures (including critical care time)  Labs Reviewed - No data to display Dg Ribs Unilateral W/chest Left  08/25/2011  *RADIOLOGY REPORT*  Clinical Data: Fall, left lateral rib pain  LEFT RIBS AND CHEST - 3+ VIEW  Comparison: Chest CT 08/14/2011  Findings: Right lateral remote rib fractures are again noted.  The patient is rotated to the left.  Heart size is normal.  Linear left lower lobe presumed atelectasis is noted.  No pleural effusion. There is a mildly overlapping fracture of the left posterior sixth rib and seventh rib.  No pneumothorax.  IMPRESSION: Acute left six  and seventh rib fractures.  No pneumothorax.  Original Report Authenticated By: Harrel Lemon, M.D.     1. Rib fractures     3:10 PM Patient seen and examined. X-ray ordered. Medications ordered.   Vital signs reviewed and are as follows: Filed Vitals:   08/25/11 1553  BP: 105/70  Pulse: 60  Temp: 98.7 F (37.1 C)  Resp: 20   4:13 PM  X-ray reviewed by myself, patient informed. Will discharge to home with pain medication and incentive spirometer. Patient coached to take 10 deep breaths every hour using spirometer to prevent a pneumonia. She verbalizes understanding and agrees with plan.   MDM  Patient with 2 broken ribs after a mechanical fall yesterday. No evidence of head injury. Patient appears comfortable and pain is controlled in the emergency department. She has been ambulatory without difficulty. Vital signs are stable. Will discharge home.        Eustace Moore [place], [place] 08/25/11 1616

## 2011-08-26 NOTE — ED Provider Notes (Signed)
Medical screening examination/treatment/procedure(s) were conducted as a shared visit with non-physician practitioner(s) and myself.  I personally evaluated the patient during the encounter   Charbel Los A. Patrica Duel, MD 08/26/11 1057

## 2011-09-03 ENCOUNTER — Encounter (HOSPITAL_COMMUNITY): Payer: Self-pay | Admitting: *Deleted

## 2011-09-03 ENCOUNTER — Emergency Department (HOSPITAL_COMMUNITY)
Admission: EM | Admit: 2011-09-03 | Discharge: 2011-09-03 | Disposition: A | Discharge status: Nursing Home (Includes ICF) | Payer: Medicare Other | Attending: Emergency Medicine | Admitting: Emergency Medicine

## 2011-09-03 DIAGNOSIS — S2239XA Fracture of one rib, unspecified side, initial encounter for closed fracture: Secondary | ICD-10-CM | POA: Insufficient documentation

## 2011-09-03 DIAGNOSIS — Z09 Encounter for follow-up examination after completed treatment for conditions other than malignant neoplasm: Secondary | ICD-10-CM | POA: Insufficient documentation

## 2011-09-03 DIAGNOSIS — W19XXXA Unspecified fall, initial encounter: Secondary | ICD-10-CM | POA: Insufficient documentation

## 2011-09-03 DIAGNOSIS — R079 Chest pain, unspecified: Secondary | ICD-10-CM | POA: Insufficient documentation

## 2011-09-03 DIAGNOSIS — Z79899 Other long term (current) drug therapy: Secondary | ICD-10-CM | POA: Insufficient documentation

## 2011-09-03 DIAGNOSIS — K219 Gastro-esophageal reflux disease without esophagitis: Secondary | ICD-10-CM | POA: Insufficient documentation

## 2011-09-03 DIAGNOSIS — F319 Bipolar disorder, unspecified: Secondary | ICD-10-CM | POA: Insufficient documentation

## 2011-09-03 MED ORDER — OXYCODONE-ACETAMINOPHEN 5-325 MG PO TABS: 1.0000 | ORAL_TABLET | ORAL | Status: AC | PRN

## 2011-09-03 NOTE — ED Notes (Signed)
Patient states that she fell and injured her left lower chest.  States that she has run out of pain medication.  States that she does not get any relief from OTC meds.

## 2011-09-03 NOTE — ED Notes (Signed)
Unable to find pt in triage or waiting room. 

## 2011-09-03 NOTE — ED Notes (Signed)
C/o L axillary rib pain, fell ~ 2 weeks ago, ran out of pain med. Here by EMS for pain control/pain med, denies sx other than pain, alert, NAD, calm, interactive, speaking in clear complete sentences, no coughing noted. LS CTA.

## 2011-09-03 NOTE — ED Provider Notes (Signed)
History     CSN: [id number]  Arrival date & time 09/03/11  1900   First MD Initiated Contact with Patient 09/03/11 2058      Chief Complaint  Patient presents with  . Chest Pain    L axillary rib pain, known rib fx    (Consider location/radiation/quality/duration/timing/severity/associated sxs/prior treatment) HPI Comments: Patient here with known rib fractures which occurred 2 weeks ago s/p fall - was seen here got percocet for pain but has since run out - has not seen PCP for refill.  States continues with left lateral rib pain - no shortness of breath, no hemoptysis.  Patient is a 51 y.o. female presenting with chest pain. The history is provided by the patient. No language interpreter was used.  Chest Pain The chest pain began 1 - 2 weeks ago. Chest pain occurs constantly. The chest pain is unchanged. The pain is associated with breathing and coughing. At its most intense, the pain is at 7/10. The pain is currently at 7/10. The severity of the pain is moderate. The quality of the pain is described as aching. The pain does not radiate. Chest pain is worsened by certain positions and deep breathing. Pertinent negatives for primary symptoms include no fever, no fatigue, no syncope, no shortness of breath, no cough, no wheezing, no palpitations, no abdominal pain, no nausea, no vomiting, no dizziness and no altered mental status.  Pertinent negatives for associated symptoms include no claudication, no diaphoresis, no lower extremity edema, no near-syncope, no numbness, no orthopnea, no paroxysmal nocturnal dyspnea and no weakness. She tried narcotics for the symptoms. Risk factors include no known risk factors.     Past Medical History  Diagnosis Date  . Gastro - esophageal reflux disease   . Schizoaffective disorder   . Bipolar mood disorder   . Bipolar affective     complient with meds    Past Surgical History  Procedure Date  . Tubal ligation     Family History  Problem  Relation Age of Onset  . Diabetes Mother   . Heart failure Father     History  Substance Use Topics  . Smoking status: Current Everyday Smoker -- 1.0 packs/day    Types: Cigarettes  . Smokeless tobacco: Not on file  . Alcohol Use: No    OB History    Grav Para Term Preterm Abortions TAB SAB Ect Mult Living                  Review of Systems  Constitutional: Negative for fever, diaphoresis and fatigue.  Respiratory: Negative for cough, shortness of breath and wheezing.   Cardiovascular: Positive for chest pain. Negative for palpitations, orthopnea, claudication, syncope and near-syncope.  Gastrointestinal: Negative for nausea, vomiting and abdominal pain.  Neurological: Negative for dizziness, weakness and numbness.  Psychiatric/Behavioral: Negative for altered mental status.  All other systems reviewed and are negative.    Allergies  Penicillins  Home Medications   Current Outpatient Rx  Name Route Sig Dispense Refill  . BUSPIRONE HCL 10 MG PO TABS Oral Take 10 mg by mouth daily.     Marland Kitchen CARBAMAZEPINE 200 MG PO TABS Oral Take 600 mg by mouth at bedtime. For mood stability    . CITALOPRAM HYDROBROMIDE 40 MG PO TABS Oral Take 40 mg by mouth daily.     Marland Kitchen CLONAZEPAM 0.5 MG PO TABS Oral Take 0.5 mg by mouth 2 (two) times daily. For anxiety.    . DOCUSATE SODIUM 100 MG  PO CAPS Oral Take 100 mg by mouth 2 (two) times daily. To soften stool    . LORATADINE 10 MG PO TABS Oral Take 10 mg by mouth daily. For allergies    . OMEPRAZOLE 20 MG PO CPDR Oral Take 20 mg by mouth daily. Do not crush    . OXYCODONE-ACETAMINOPHEN 5-325 MG PO TABS Oral Take 1-2 tablets by mouth every 6 (six) hours as needed. For pain.    Marland Kitchen RISPERIDONE 3 MG PO TABS Oral Take 3 mg by mouth 2 (two) times daily.     Marland Kitchen ZOLPIDEM TARTRATE 10 MG PO TABS Oral Take 10 mg by mouth at bedtime.      BP 109/70  Pulse 89  Temp(Src) 98.1 F (36.7 C) (Oral)  Resp 20  SpO2 95%  Physical Exam  Nursing note and vitals  reviewed. Constitutional: She is oriented to person, place, and time. She appears well-developed and well-nourished. No distress.  HENT:  Head: Normocephalic and atraumatic.  Right Ear: External ear normal.  Left Ear: External ear normal.  Nose: Nose normal.  Mouth/Throat: Oropharynx is clear and moist. No oropharyngeal exudate.  Eyes: Conjunctivae are normal. Pupils are equal, round, and reactive to light. No scleral icterus.  Neck: Normal range of motion. Neck supple.  Cardiovascular: Normal rate and normal heart sounds.  Exam reveals no gallop and no friction rub.   No murmur heard. Pulmonary/Chest: Effort normal and breath sounds normal. No respiratory distress. She has no decreased breath sounds. She has no wheezes. She has no rhonchi. She has no rales. She exhibits tenderness. She exhibits no crepitus and no deformity.    Abdominal: Soft. Bowel sounds are normal. She exhibits no distension. There is no tenderness.  Musculoskeletal: Normal range of motion.  Lymphadenopathy:    She has no cervical adenopathy.  Neurological: She is alert and oriented to person, place, and time. No cranial nerve deficit.  Skin: Skin is warm and dry. No rash noted. No erythema. No pallor.  Psychiatric: She has a normal mood and affect. Her behavior is normal. Judgment and thought content normal.    ED Course  Procedures (including critical care time)  Labs Reviewed - No data to display No results found.   Rib fractures    MDM  Patient with known left rib fractures - has not seen PCP for refill of pain medication - explained to patient that will only give her a day's worth so she will follow up with Dr. Jeannetta Nap for refills of pain medication.  She is in agreement with this - no acute findings.        Izola Price [place], [place] 09/03/11 2116

## 2011-09-04 NOTE — ED Provider Notes (Signed)
Medical screening examination/treatment/procedure(s) were performed by non-physician practitioner and as supervising physician I was immediately available for consultation/collaboration.   Jessicamarie Amiri W Chellsie Gomer, MD 09/04/11 1842 

## 2011-09-08 ENCOUNTER — Emergency Department (HOSPITAL_COMMUNITY)
Admission: EM | Admit: 2011-09-08 | Discharge: 2011-09-09 | Disposition: A | Discharge status: Other Acute Inpatient Hospital | Payer: Medicare Other | Attending: Emergency Medicine | Admitting: Emergency Medicine

## 2011-09-08 ENCOUNTER — Encounter (HOSPITAL_COMMUNITY): Payer: Self-pay | Admitting: *Deleted

## 2011-09-08 DIAGNOSIS — S2239XA Fracture of one rib, unspecified side, initial encounter for closed fracture: Secondary | ICD-10-CM | POA: Insufficient documentation

## 2011-09-08 DIAGNOSIS — F17200 Nicotine dependence, unspecified, uncomplicated: Secondary | ICD-10-CM | POA: Insufficient documentation

## 2011-09-08 DIAGNOSIS — R079 Chest pain, unspecified: Secondary | ICD-10-CM | POA: Insufficient documentation

## 2011-09-08 DIAGNOSIS — F319 Bipolar disorder, unspecified: Secondary | ICD-10-CM | POA: Insufficient documentation

## 2011-09-08 DIAGNOSIS — W19XXXA Unspecified fall, initial encounter: Secondary | ICD-10-CM | POA: Insufficient documentation

## 2011-09-08 DIAGNOSIS — Z79899 Other long term (current) drug therapy: Secondary | ICD-10-CM | POA: Insufficient documentation

## 2011-09-08 DIAGNOSIS — K219 Gastro-esophageal reflux disease without esophagitis: Secondary | ICD-10-CM | POA: Insufficient documentation

## 2011-09-08 NOTE — ED Notes (Signed)
Per EMS- pt in c/o rib pain x2 weeks, treated for rib fx two weeks ago, no new injury

## 2011-09-08 NOTE — ED Provider Notes (Signed)
History     CSN: [id number]  Arrival date & time 09/08/11  2104   First MD Initiated Contact with Patient 09/08/11 2329      Chief Complaint  Patient presents with  . Chest Pain    L side  . Fall    x 2 weeks ago    (Consider location/radiation/quality/duration/timing/severity/associated sxs/prior treatment) HPI Comments: Patient presents emergency department with chief complaint of out of pain medication.patient has been to the emergency department multiple times for right rib fracture.  She is been advised to followup with her primary care physician for pain management, but she states she has not seen him.  Patient states she is here for another pain medication prescription.  She denies any change in her pain and states that it's in the exact same location from the previous rib fractures.  Patient denies any new chest pain, shortness of breath, dyspnea on exertion claudication, leg swelling, extremity weakness, headaches, fevers, night sweats, chills.  Patient has no other complaints at this time  Patient is a 51 y.o. female presenting with chest pain and fall. The history is provided by the patient.  Chest Pain Pertinent negatives for primary symptoms include no fever, no shortness of breath, no abdominal pain and no dizziness.  Pertinent negatives for associated symptoms include no numbness and no weakness.    Fall Pertinent negatives include no fever, no numbness, no abdominal pain and no headaches.    Past Medical History  Diagnosis Date  . Gastro - esophageal reflux disease   . Schizoaffective disorder   . Bipolar mood disorder   . Bipolar affective     complient with meds    Past Surgical History  Procedure Date  . Tubal ligation     Family History  Problem Relation Age of Onset  . Diabetes Mother   . Heart failure Father     History  Substance Use Topics  . Smoking status: Current Everyday Smoker -- 1.0 packs/day    Types: Cigarettes  . Smokeless  tobacco: Not on file  . Alcohol Use: No    OB History    Grav Para Term Preterm Abortions TAB SAB Ect Mult Living                  Review of Systems  Constitutional: Negative for fever, chills and appetite change.  HENT: Negative for congestion.   Eyes: Negative for visual disturbance.  Respiratory: Negative for shortness of breath.   Cardiovascular: Positive for chest pain (rib pain s/p rib fracture). Negative for leg swelling.  Gastrointestinal: Negative for abdominal pain.  Genitourinary: Negative for dysuria, urgency and frequency.  Neurological: Negative for dizziness, syncope, weakness, light-headedness, numbness and headaches.  Psychiatric/Behavioral: Negative for confusion.  All other systems reviewed and are negative.    Allergies  Penicillins  Home Medications   Current Outpatient Rx  Name Route Sig Dispense Refill  . BUSPIRONE HCL 10 MG PO TABS Oral Take 10 mg by mouth daily.     Marland Kitchen CARBAMAZEPINE 200 MG PO TABS Oral Take 600 mg by mouth at bedtime. For mood stability    . CITALOPRAM HYDROBROMIDE 40 MG PO TABS Oral Take 40 mg by mouth daily.     Marland Kitchen CLONAZEPAM 0.5 MG PO TABS Oral Take 0.5 mg by mouth 2 (two) times daily. For anxiety.    . DOCUSATE SODIUM 100 MG PO CAPS Oral Take 100 mg by mouth 2 (two) times daily. To soften stool    . LORATADINE  10 MG PO TABS Oral Take 10 mg by mouth daily. For allergies    . OMEPRAZOLE 20 MG PO CPDR Oral Take 20 mg by mouth daily. Do not crush    . OXYCODONE-ACETAMINOPHEN 5-325 MG PO TABS Oral Take 1 tablet by mouth every 4 (four) hours as needed for pain. 10 tablet 0  . RISPERIDONE 3 MG PO TABS Oral Take 3 mg by mouth 2 (two) times daily.     Marland Kitchen ZOLPIDEM TARTRATE 10 MG PO TABS Oral Take 10 mg by mouth at bedtime.      BP 108/72  Pulse 66  Temp(Src) 97.9 F (36.6 C) (Oral)  Resp 17  Ht 5\' 4"  (1.626 m)  Wt 204 lb (92.534 kg)  BMI 35.02 kg/m2  SpO2 94%  Physical Exam  Nursing note and vitals reviewed. Constitutional: She  is oriented to person, place, and time. She appears well-developed and well-nourished. No distress.  HENT:  Head: Normocephalic and atraumatic.  Mouth/Throat: Oropharynx is clear and moist. No oropharyngeal exudate.  Eyes: Conjunctivae and EOM are normal. Pupils are equal, round, and reactive to light. No scleral icterus.  Neck: Normal range of motion. Neck supple. No tracheal deviation present. No thyromegaly present.  Cardiovascular: Normal rate, regular rhythm, normal heart sounds and intact distal pulses.   Pulmonary/Chest: Effort normal and breath sounds normal. No stridor. No respiratory distress. She has no wheezes.    Abdominal: Soft.  Musculoskeletal: Normal range of motion. She exhibits no edema and no tenderness.  Neurological: She is alert and oriented to person, place, and time. Coordination normal.  Skin: Skin is warm and dry. No rash noted. She is not diaphoretic. No erythema. No pallor.  Psychiatric: She has a normal mood and affect. Her behavior is normal.    ED Course  Procedures (including critical care time)  Labs Reviewed - No data to display No results found.   No diagnosis found.    MDM  Patient diagnosed with 2 rib fractures on February 2.  Patient has been given multiple prescriptions of Percocet for rib pain.  Patient is to followup with her primary care doctor for pain management.  It was explained that I will treat patient's pain while in the emergency department but a script will not be given. Pt is to use OTC motrin for pain.   Pt non concerning for PNA, no cough fever, night sweats or chills.      Jaci Carrel, [place] 09/09/11 0007

## 2011-09-08 NOTE — ED Notes (Signed)
Pt states fall x 2 weeks ago. Seen by MD and rx'd percocet. Pt states she is out of pain medication and has not followed up w/ regular doctor.

## 2011-09-09 MED ORDER — OXYCODONE-ACETAMINOPHEN 5-325 MG PO TABS
1.0000 | ORAL_TABLET | Freq: Once | ORAL | Status: AC
  Administered 2011-09-09: 1 via ORAL
  Filled 2011-09-09: qty 1

## 2011-09-09 NOTE — ED Provider Notes (Signed)
Medical screening examination/treatment/procedure(s) were performed by non-physician practitioner and as supervising physician I was immediately available for consultation/collaboration.  Taelor Waymire P Kratos Ruscitti, MD 09/09/11 0013 

## 2011-09-09 NOTE — ED Notes (Signed)
Called [facility] for pickup.  

## 2011-09-09 NOTE — Discharge Instructions (Signed)
Followup with your primary care physician Dr. Jeannetta Nap for pain management of your rib fracture that occurred on February 2.  Rib Fracture The ribs are like a cage that goes around your upper chest. The ribs protect your lungs. Your ribs move when you breathe, so it hurts if a rib is broken. HOME CARE  Avoid activities that cause pain to the injured area. Protect your injured area.   Eat a normal, healthy diet.   Drink enough fluids to keep your pee (urine) clear or pale yellow.   Take deep breaths many times a day. Cough many times a day while hugging a pillow.   Do not wear a rib belt or binder on the chest unless told by your doctor. Rib belts or binders do not allow you to breathe deeply.   Only take medicine as told by your doctor.  GET HELP RIGHT AWAY IF:   You have a fever.   You cannot stop coughing or cough up thick or bloody spit (mucus).   You have trouble breathing.   You feel sick to your stomach (nauseous), throw up (vomit), or have belly (abdominal) pain.   Your pain gets worse and medicine does not help.  MAKE SURE YOU:   Understand these instructions.   Will watch this condition.   Will get help right away if you are not doing well or get worse.  Document Released: 04/17/2008 Document Revised: 03/21/2011 Document Reviewed: 04/17/2008 [facility] Patient Information 2012 [place], [place].

## 2011-09-09 NOTE — ED Notes (Signed)
[facility] here to pick up patient 

## 2011-12-25 ENCOUNTER — Other Ambulatory Visit: Payer: Self-pay | Admitting: Family Medicine

## 2011-12-25 DIAGNOSIS — Z1231 Encounter for screening mammogram for malignant neoplasm of breast: Secondary | ICD-10-CM

## 2012-01-25 ENCOUNTER — Ambulatory Visit
Admission: RE | Admit: 2012-01-25 | Discharge: 2012-01-25 | Disposition: A | Discharge status: Home / Self Care | Payer: Medicare Other | Source: Ambulatory Visit | Attending: Family Medicine | Admitting: Family Medicine

## 2012-01-25 DIAGNOSIS — Z1231 Encounter for screening mammogram for malignant neoplasm of breast: Secondary | ICD-10-CM

## 2012-03-24 ENCOUNTER — Encounter (HOSPITAL_COMMUNITY): Payer: Self-pay | Admitting: *Deleted

## 2012-03-24 ENCOUNTER — Emergency Department (HOSPITAL_COMMUNITY)
Admission: EM | Admit: 2012-03-24 | Discharge: 2012-03-25 | Disposition: A | Discharge status: Home / Self Care | Payer: Medicare Other | Attending: Emergency Medicine | Admitting: Emergency Medicine

## 2012-03-24 DIAGNOSIS — F411 Generalized anxiety disorder: Secondary | ICD-10-CM | POA: Insufficient documentation

## 2012-03-24 DIAGNOSIS — Z8659 Personal history of other mental and behavioral disorders: Secondary | ICD-10-CM | POA: Insufficient documentation

## 2012-03-24 DIAGNOSIS — F329 Major depressive disorder, single episode, unspecified: Secondary | ICD-10-CM | POA: Insufficient documentation

## 2012-03-24 DIAGNOSIS — F3289 Other specified depressive episodes: Secondary | ICD-10-CM | POA: Insufficient documentation

## 2012-03-24 DIAGNOSIS — Z79899 Other long term (current) drug therapy: Secondary | ICD-10-CM | POA: Insufficient documentation

## 2012-03-24 HISTORY — DX: Depression, unspecified: F32.A

## 2012-03-24 HISTORY — DX: Major depressive disorder, single episode, unspecified: F32.9

## 2012-03-24 LAB — RAPID URINE DRUG SCREEN, HOSP PERFORMED
Amphetamines: NOT DETECTED
Barbiturates: NOT DETECTED

## 2012-03-24 LAB — CBC
HCT: 47.1 % — ABNORMAL HIGH (ref 36.0–46.0)
Hemoglobin: 17.4 g/dL — ABNORMAL HIGH (ref 12.0–15.0)
MCV: 91.8 fL (ref 78.0–100.0)
RBC: 5.13 MIL/uL — ABNORMAL HIGH (ref 3.87–5.11)
WBC: 8.4 10*3/uL (ref 4.0–10.5)

## 2012-03-24 LAB — COMPREHENSIVE METABOLIC PANEL
BUN: 6 mg/dL (ref 6–23)
CO2: 29 mEq/L (ref 19–32)
Chloride: 96 mEq/L (ref 96–112)
Creatinine, Ser: 0.72 mg/dL (ref 0.50–1.10)
GFR calc non Af Amer: >90 mL/min (ref >90)
Total Bilirubin: 0.2 mg/dL — ABNORMAL LOW (ref 0.3–1.2)

## 2012-03-24 LAB — ACETAMINOPHEN LEVEL: Acetaminophen (Tylenol), Serum: <15 ug/mL (ref 10–30)

## 2012-03-24 LAB — ETHANOL: Alcohol, Ethyl (B): <11 mg/dL (ref 0–11)

## 2012-03-24 MED ORDER — LORATADINE 10 MG PO TABS
10.0000 mg | ORAL_TABLET | Freq: Every day | ORAL | Status: DC
  Filled 2012-03-24 (×2): qty 1

## 2012-03-24 MED ORDER — RISPERIDONE 2 MG PO TABS
3.0000 mg | ORAL_TABLET | Freq: Two times a day (BID) | ORAL | Status: DC
  Administered 2012-03-24: 3 mg via ORAL
  Filled 2012-03-24: qty 1

## 2012-03-24 MED ORDER — CARBAMAZEPINE 200 MG PO TABS
600.0000 mg | ORAL_TABLET | Freq: Every day | ORAL | Status: DC
  Administered 2012-03-24: 600 mg via ORAL
  Filled 2012-03-24 (×2): qty 3

## 2012-03-24 MED ORDER — LORAZEPAM 1 MG PO TABS: 1.0000 mg | ORAL_TABLET | Freq: Three times a day (TID) | ORAL | Status: DC | PRN

## 2012-03-24 MED ORDER — ZOLPIDEM TARTRATE 10 MG PO TABS
10.0000 mg | ORAL_TABLET | Freq: Every day | ORAL | Status: DC
  Administered 2012-03-24: 10 mg via ORAL
  Filled 2012-03-24: qty 1

## 2012-03-24 MED ORDER — CLONAZEPAM 0.5 MG PO TABS
0.5000 mg | ORAL_TABLET | Freq: Two times a day (BID) | ORAL | Status: DC
  Administered 2012-03-24: 0.5 mg via ORAL
  Filled 2012-03-24: qty 1

## 2012-03-24 MED ORDER — ACETAMINOPHEN 325 MG PO TABS: 650.0000 mg | ORAL_TABLET | ORAL | Status: DC | PRN

## 2012-03-24 MED ORDER — CITALOPRAM HYDROBROMIDE 40 MG PO TABS
40.0000 mg | ORAL_TABLET | Freq: Every day | ORAL | Status: DC
  Filled 2012-03-24 (×2): qty 1

## 2012-03-24 MED ORDER — ZOLPIDEM TARTRATE 5 MG PO TABS: 5.0000 mg | ORAL_TABLET | Freq: Every evening | ORAL | Status: DC | PRN

## 2012-03-24 MED ORDER — DOCUSATE SODIUM 100 MG PO CAPS
100.0000 mg | ORAL_CAPSULE | Freq: Two times a day (BID) | ORAL | Status: DC
  Filled 2012-03-24 (×3): qty 1

## 2012-03-24 MED ORDER — NICOTINE 21 MG/24HR TD PT24: 21.0000 mg | MEDICATED_PATCH | Freq: Every day | TRANSDERMAL | Status: DC

## 2012-03-24 MED ORDER — IBUPROFEN 600 MG PO TABS: 600.0000 mg | ORAL_TABLET | Freq: Three times a day (TID) | ORAL | Status: DC | PRN

## 2012-03-24 MED ORDER — BUSPIRONE HCL 10 MG PO TABS
10.0000 mg | ORAL_TABLET | Freq: Every day | ORAL | Status: DC
  Administered 2012-03-24: 10 mg via ORAL
  Filled 2012-03-24: qty 1

## 2012-03-24 MED ORDER — ONDANSETRON HCL 4 MG PO TABS: 4.0000 mg | ORAL_TABLET | Freq: Three times a day (TID) | ORAL | Status: DC | PRN

## 2012-03-24 NOTE — ED Provider Notes (Signed)
History     CSN: [id number]  Arrival date & time 03/24/12  4098   First MD Initiated Contact with Patient 03/24/12 1905      Chief Complaint  Patient presents with  . Medical Clearance    (Consider location/radiation/quality/duration/timing/severity/associated sxs/prior treatment) HPI Comments: Patient presents voluntarily today due to having suicidal thoughts.  She states that she has had suicidal thoughts over the past week.  She denies suicide plan.  She has a history of prior suicide attempts.  She also has a history of Bipolar and Schizoaffective disorder.  Her Psychiatrist is Dr. Hortencia Pilar.  She is currently on Buspar, Celexa, Risperdal, and Klonopin.  She reports that she is taking medication as directed.  She denies any homicidal thoughts.  She denies alcohol or recreational drug use.  The history is provided by the patient.    Past Medical History  Diagnosis Date  . Gastro - esophageal reflux disease   . Schizoaffective disorder   . Bipolar mood disorder   . Bipolar affective     complient with meds    Past Surgical History  Procedure Date  . Tubal ligation     Family History  Problem Relation Age of Onset  . Diabetes Mother   . Heart failure Father     History  Substance Use Topics  . Smoking status: Current Everyday Smoker -- 1.0 packs/day    Types: Cigarettes  . Smokeless tobacco: Not on file  . Alcohol Use: No    OB History    Grav Para Term Preterm Abortions TAB SAB Ect Mult Living                  Review of Systems  Constitutional: Negative for fever and chills.  HENT: Negative for neck pain and neck stiffness.   Respiratory: Negative for shortness of breath.   Cardiovascular: Negative for chest pain.  Psychiatric/Behavioral: Positive for suicidal ideas and dysphoric mood. Negative for hallucinations, confusion and self-injury. The patient is nervous/anxious.     Allergies  Penicillins  Home Medications   Current Outpatient Rx  Name  Route Sig Dispense Refill  . BUSPIRONE HCL 10 MG PO TABS Oral Take 10 mg by mouth daily.     Marland Kitchen CARBAMAZEPINE 200 MG PO TABS Oral Take 600 mg by mouth at bedtime. For mood stability    . CITALOPRAM HYDROBROMIDE 40 MG PO TABS Oral Take 40 mg by mouth daily.     Marland Kitchen CLONAZEPAM 0.5 MG PO TABS Oral Take 0.5 mg by mouth 2 (two) times daily. For anxiety.    . DOCUSATE SODIUM 100 MG PO CAPS Oral Take 100 mg by mouth 2 (two) times daily. To soften stool    . LORATADINE 10 MG PO TABS Oral Take 10 mg by mouth daily. For allergies    . OMEPRAZOLE 20 MG PO CPDR Oral Take 20 mg by mouth daily. Do not crush    . RISPERIDONE 3 MG PO TABS Oral Take 3 mg by mouth 2 (two) times daily.     Marland Kitchen ZOLPIDEM TARTRATE 10 MG PO TABS Oral Take 10 mg by mouth at bedtime.      BP 103/69  Pulse 72  Temp 98.8 F (37.1 C) (Oral)  Resp 16  SpO2 93%  Physical Exam  Nursing note and vitals reviewed. Constitutional: She appears well-developed and well-nourished. No distress.  HENT:  Head: Normocephalic and atraumatic.  Mouth/Throat: Oropharynx is clear and moist.  Eyes: EOM are normal. Pupils are equal, round,  and reactive to light.  Neck: Normal range of motion. Neck supple.  Cardiovascular: Normal rate, regular rhythm and normal heart sounds.   Pulmonary/Chest: Effort normal and breath sounds normal.  Neurological: She is alert.  Skin: Skin is warm and dry. She is not diaphoretic.  Psychiatric: Judgment normal. Her mood appears anxious. Her speech is delayed. She is withdrawn. Thought content is not paranoid and not delusional. Cognition and memory are normal. She exhibits a depressed mood. She expresses suicidal ideation. She expresses no homicidal ideation. She expresses no suicidal plans and no homicidal plans.    ED Course  Procedures (including critical care time)  Labs Reviewed  CBC - Abnormal; Notable for the following:    RBC 5.13 (*)     Hemoglobin 17.4 (*)     HCT 47.1 (*)     MCHC 36.9 (*)     All  other components within normal limits  COMPREHENSIVE METABOLIC PANEL - Abnormal; Notable for the following:    Sodium 134 (*)     Glucose, Bld 113 (*)     Total Bilirubin 0.2 (*)     All other components within normal limits  ETHANOL  ACETAMINOPHEN LEVEL  URINE RAPID DRUG SCREEN (HOSP PERFORMED)   No results found.   No diagnosis found.  Discussed with ACT team who reports that they will come see the patient and evaluate.  MDM  Patient with a history of Bipolar and Schizoaffective Disorder presents today with suicidal thoughts.  No suicidal plan.  History of prior suicide attempts.  She does not feel that she would be safe is she was discharged home.  Patient discussed with ACT who will come evaluate the patient.  Psych holding orders have been placed.  Her regular medications have been ordered.        Pascal Lux [place], PA-C 03/25/12 [phone]

## 2012-03-24 NOTE — ED Notes (Signed)
Brought in by [facility] voluntary, feels suicidal without plan.

## 2012-03-25 ENCOUNTER — Encounter (HOSPITAL_COMMUNITY): Payer: Self-pay | Admitting: *Deleted
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
## 2012-03-25 NOTE — ED Notes (Signed)
Pt transported back to [facility] by [employer]

## 2012-03-25 NOTE — ED Provider Notes (Signed)
The patient has been assessed by the on-call [facility] psychiatrist who has recommended discharge back to her living facility.  The patient is stable for discharge without any active suicidal thoughts or plans.  [name], MD 03/25/12 [phone]

## 2012-03-27 IMAGING — CR DG CHEST 2V
2 series · 2 of 2 positions shown · non-contrast
Comparison: 07/02/2011

CLINICAL DATA: Chest pain

CHEST - 2 VIEW

[w chest pa]
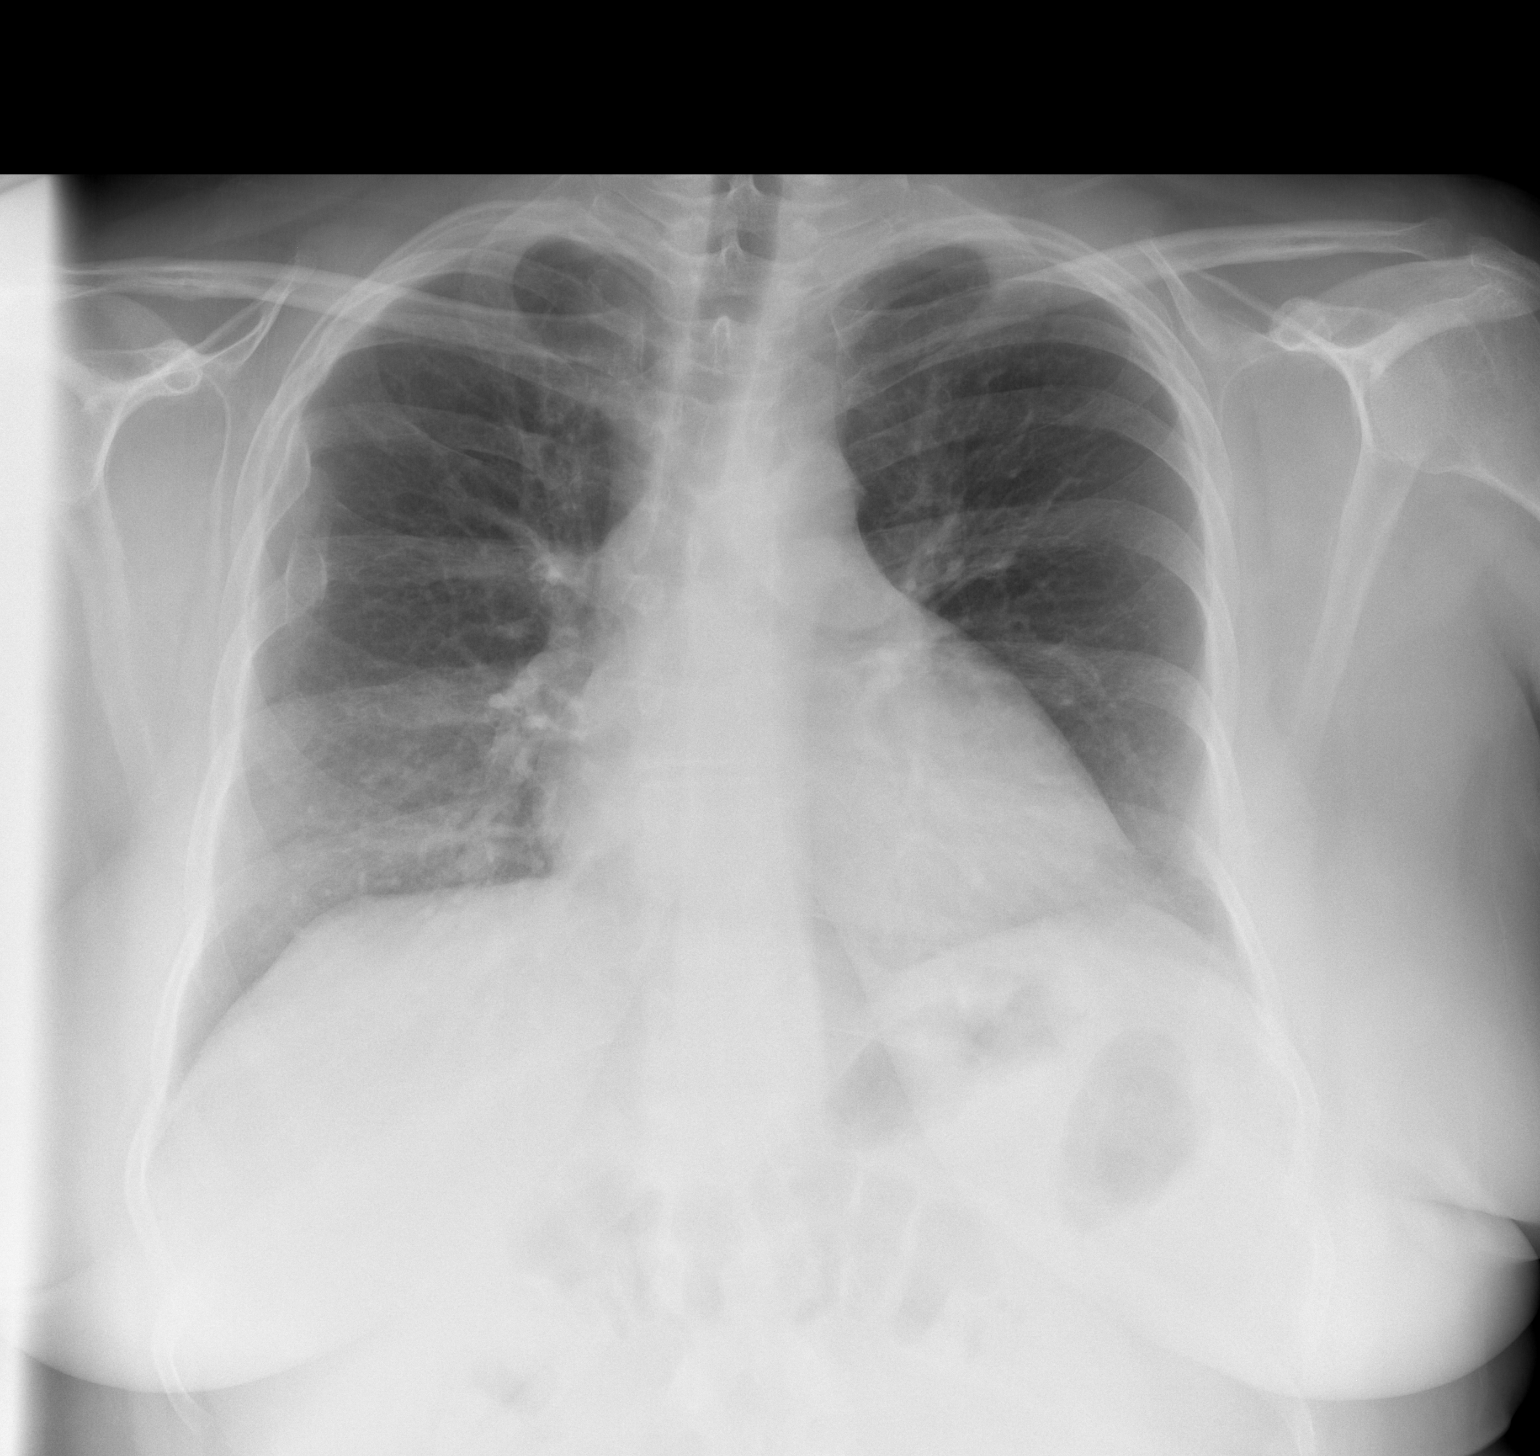

[w chest lat]
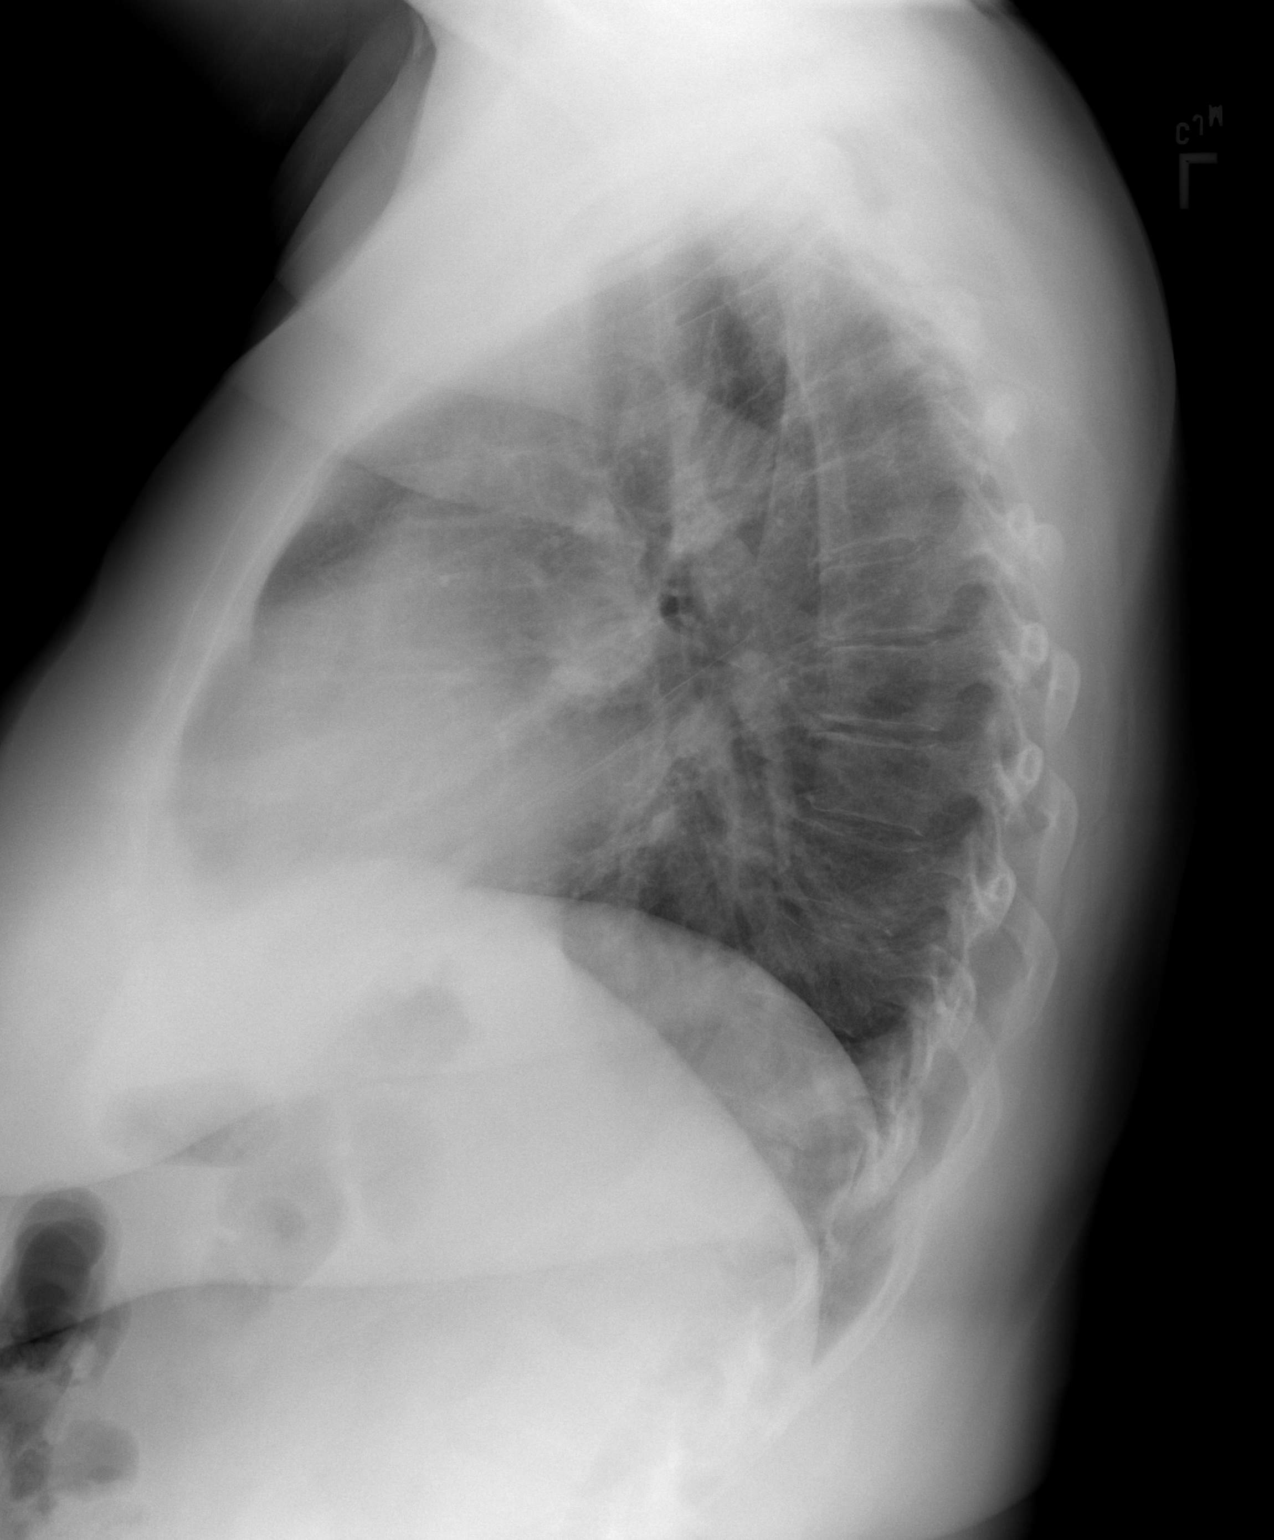

[2 of 2 positions shown; findings below may reference images not displayed]

FINDINGS: Lateral view degraded by patient arm position.

Remote right rib fractures. Midline trachea.  Mild cardiomegaly.
Mediastinal contours otherwise within normal limits.  Mildly low
lung volumes. Clear lungs.

No congestive failure.
IMPRESSION: Mild cardiomegaly and low lung volumes.  No acute finding or
explanation for chest pain.

## 2012-03-27 NOTE — ED Provider Notes (Signed)
Medical screening examination/treatment/procedure(s) were performed by non-physician practitioner and as supervising physician I was immediately available for consultation/collaboration.   Laron Boorman L Seema Blum, MD 03/27/12 2312 

## 2012-03-28 IMAGING — CT CT HEART MORP W/ CTA COR W/ SCORE W/ CA W/CM &/OR W/O CM
1 of 6 series · 11 of 20 positions shown, 14 images · IV contrast (omnipaque)
Comparison: None

INDICATION: 50-year-old with chest pain.

CT ANGIOGRAPHY OF THE HEART, CORONARY ARTERY, STRUCTURE, AND
MORPHOLOGY
CONTRAST: 80mL OMNIPAQUE IOHEXOL 350 MG/ML IV SOLN
TECHNIQUE: CT angiography of the coronary vessels was performed on
a 256 channel system using prospective ECG gating.  A scout and
noncontrast exam (for calcium scoring) were performed.  Circulation
time was measured using a test bolus.  Coronary CTA was performed
with sub mm slice collimation during portions of the cardiac cycle
after prior injection of iodinated contrast.  Imaging post
processing was performed on an independent workstation creating
multiplanar and 3-D images, and quantitative analysis of the heart
and coronary arteries.  Note that this exam targets the heart and
the chest was not imaged in its entirety.
PREMEDICATION:
Nitroglycerin 0.4 mcg, sublingual.

[Series 8: w/ edge cor., 78.0% · axial · 0.49mm/px · z∈[-197,-93]mm · 11 of 276 slices shown, 14 images]
[im 23/276  vessel]
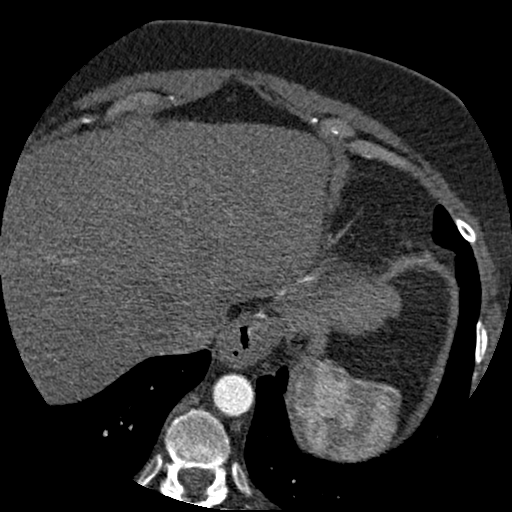
[im 23/276  lung]
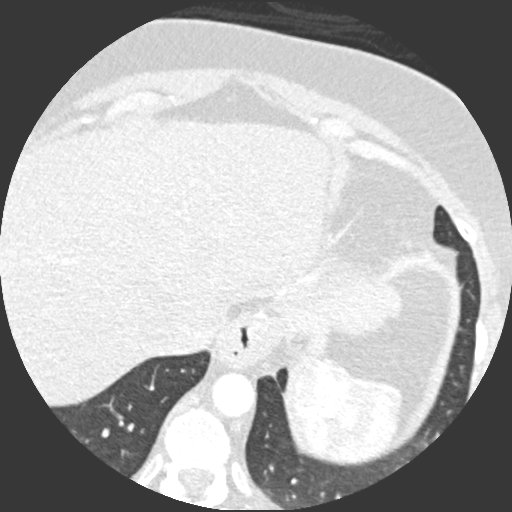
[im 46/276  vessel]
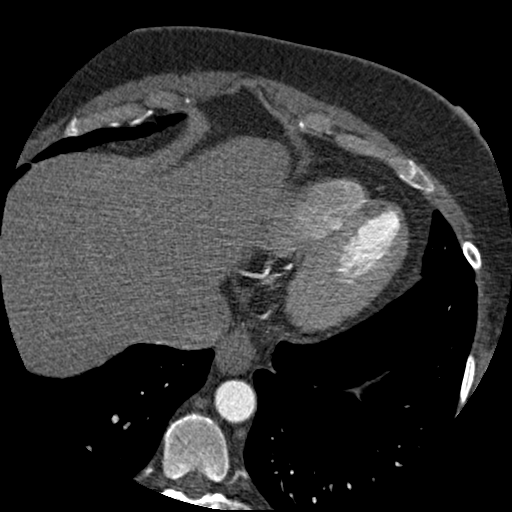
[im 69/276  vessel]
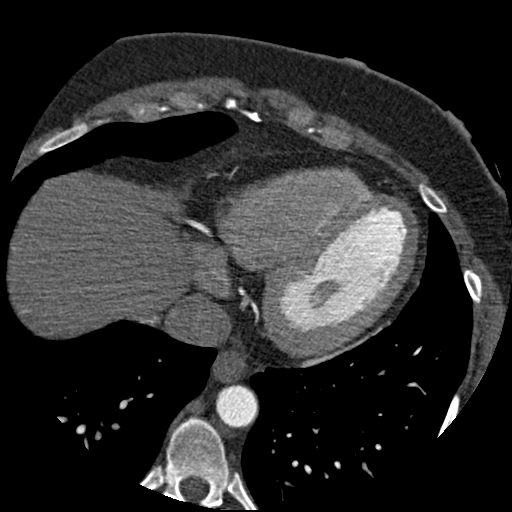
[im 92/276  vessel]
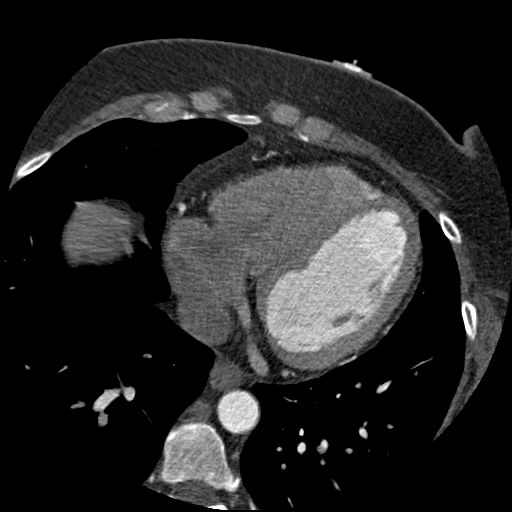
[im 115/276  vessel]
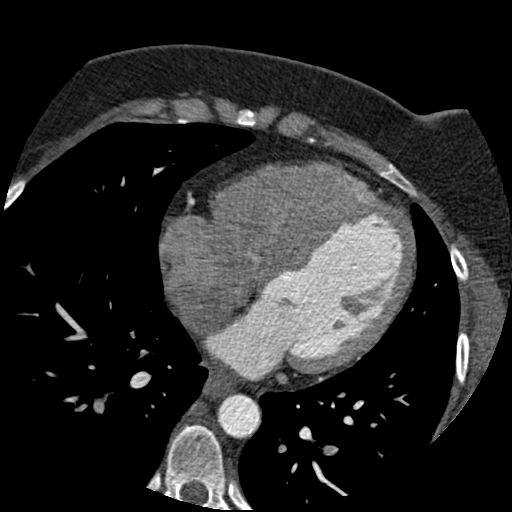
[im 115/276  lung]
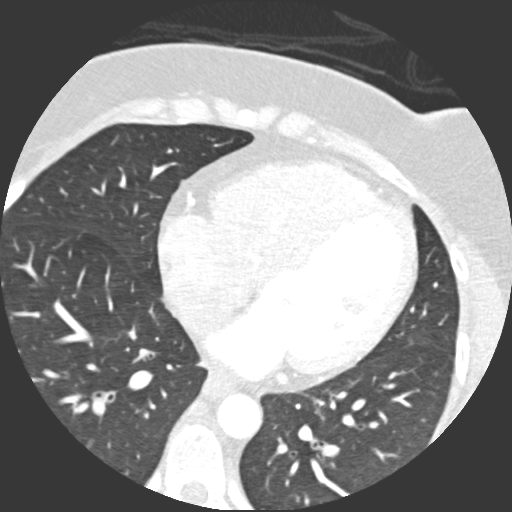
[im 138/276  vessel]
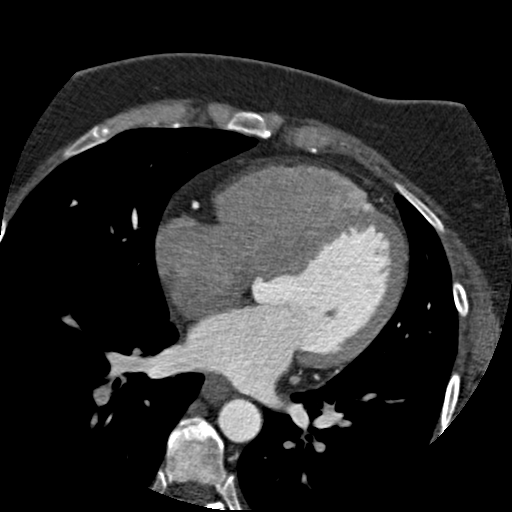
[im 161/276  vessel]
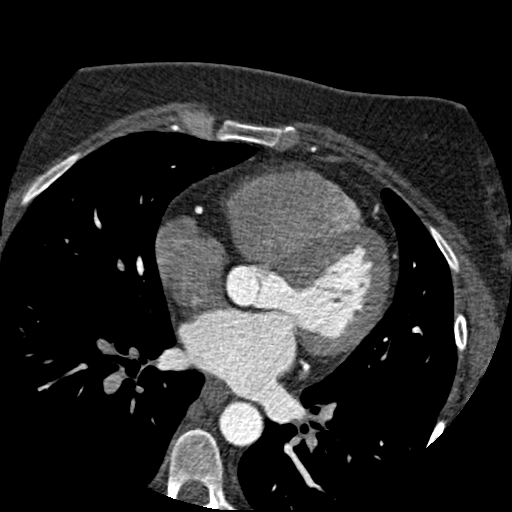
[im 184/276  vessel]
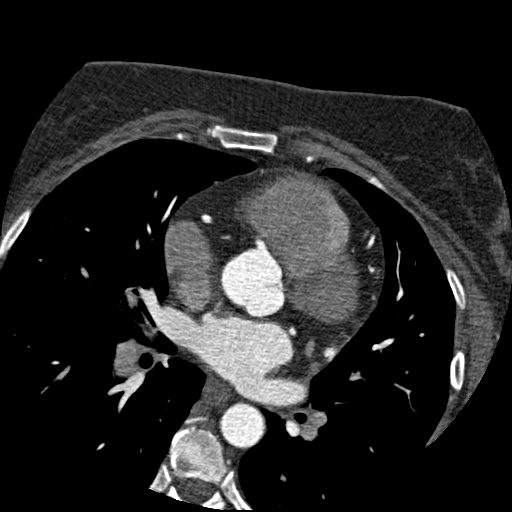
[im 207/276  vessel]
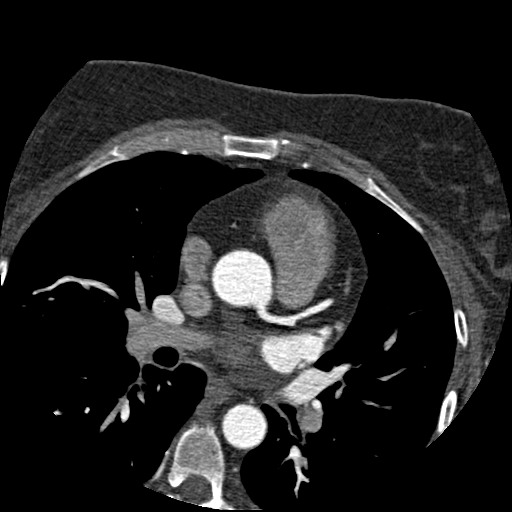
[im 207/276  lung]
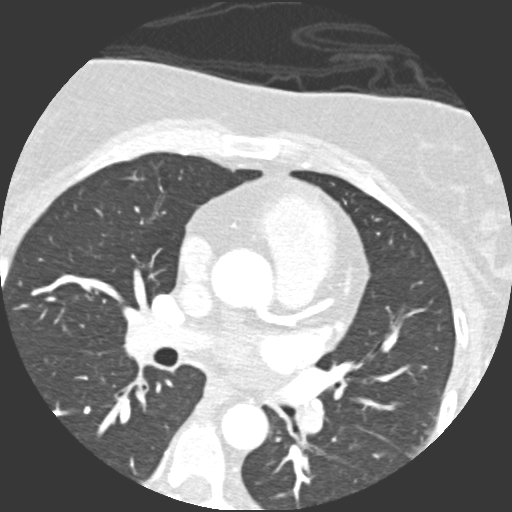
[im 230/276  vessel]
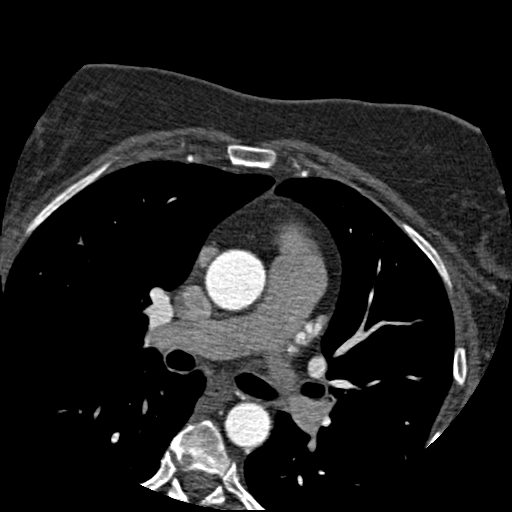
[im 253/276  vessel]
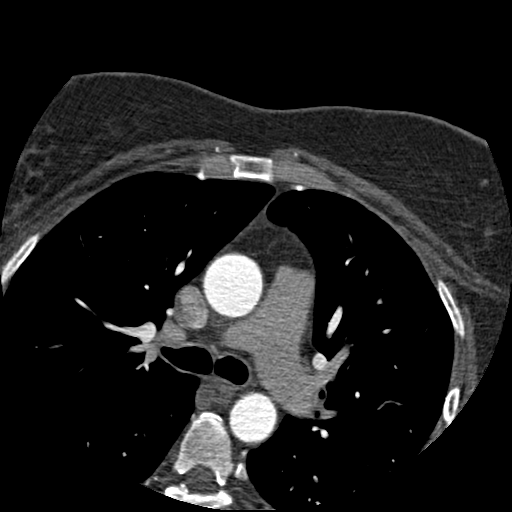

[11 of 20 positions shown; findings below may reference images not displayed]

FINDINGS: Technical quality:  Good

Heart rate:  57 beats per minute

CORONARY ARTERIES:
Left main coronary artery:  Patent without plaque or stenosis.
Left anterior descending:  Patent without plaque or stenosis.
There is a dominant diagonal vessel which is patent.
Left circumflex:  The left circumflex artery is patent without
plaque or stenosis.  There is a dominant obtuse marginal branch.
There is a small branch that there continues down the
atrioventricular groove.
Right coronary artery:  The right coronary artery is patent without
plaque or stenosis.  Posterior descending artery:  Patent.
Dominance:  Right

CORONARY CALCIUM:
Total Agatston Score:  0
[HOSPITAL] percentile:  0%

CARDIAC MEASUREMENTS:
Interventricular septum (6 - 12 mm):  9 mm
LV posterior wall (6 - 12 mm):  9 mm
LV diameter in diastole (35 - 52 mm):  45 mm

AORTA AND PULMONARY MEASUREMENTS:
Aortic root (21 - 40 mm):
            22 mm  at the annulus

            32 mm  at the sinuses of Valsalva
            26 mm  at the sinotubular junction
Ascending aorta ( <  40 mm):  28 mm
Descending aorta ( <  40 mm):  21 mm
Main pulmonary artery:  ( <  30 mm):  26 mm

EXTRACARDIAC FINDINGS:
No evidence for pericardial or pleural fluid.  There may be a small
hiatal hernia.  Normal appearance of the visualized thoracic aorta.
No gross abnormality in the upper abdominal structures.  No
significant chest lymphadenopathy.  3 mm nodule along the left
major fissure on sequence 4, image 10.  Otherwise, the visualized
lungs are clear.
IMPRESSION: 1.  Negative for coronary artery disease.  The patient's total
coronary artery calcium score is zero, which is zero percentile for
patient's matched age and gender.
2.  3 mm nodule along the left major fissure is indeterminate but
could represent a subpleural lymph node. If the patient is at high
risk for bronchogenic carcinoma, follow-up chest CT at 1 year is
recommended.  If the patient is at low risk, no follow-up is
needed.  This recommendation follows the consensus statement:
Guidelines for Management of Small Pulmonary Nodules Detected on CT
Scans:  A Statement from the [HOSPITAL] as published in
[URL]
3.  Right coronary artery dominance.

06/13/2012.

## 2012-04-25 ENCOUNTER — Emergency Department (HOSPITAL_COMMUNITY)
Admission: EM | Admit: 2012-04-25 | Discharge: 2012-04-29 | Disposition: A | Discharge status: Other Acute Inpatient Hospital | Payer: Medicare Other | Source: Home / Self Care | Attending: Emergency Medicine | Admitting: Emergency Medicine

## 2012-04-25 ENCOUNTER — Encounter (HOSPITAL_COMMUNITY): Payer: Self-pay | Admitting: Emergency Medicine

## 2012-04-25 DIAGNOSIS — Z79899 Other long term (current) drug therapy: Secondary | ICD-10-CM | POA: Insufficient documentation

## 2012-04-25 DIAGNOSIS — R45851 Suicidal ideations: Secondary | ICD-10-CM

## 2012-04-25 DIAGNOSIS — F319 Bipolar disorder, unspecified: Secondary | ICD-10-CM | POA: Insufficient documentation

## 2012-04-25 DIAGNOSIS — Z8659 Personal history of other mental and behavioral disorders: Secondary | ICD-10-CM | POA: Insufficient documentation

## 2012-04-25 LAB — CBC
Platelets: 283 10*3/uL (ref 150–400)
RBC: 5 MIL/uL (ref 3.87–5.11)
RDW: 13.8 % (ref 11.5–15.5)
WBC: 8.4 10*3/uL (ref 4.0–10.5)

## 2012-04-25 NOTE — ED Notes (Signed)
Pt states she is feeling suicidal  Pt states she has been feeling that way all day  Pt states has hx of same  Pt states she plans to put a bag over her head and suffocate herself
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
## 2012-04-26 LAB — RAPID URINE DRUG SCREEN, HOSP PERFORMED
Amphetamines: NOT DETECTED
Barbiturates: NOT DETECTED
Opiates: NOT DETECTED
Tetrahydrocannabinol: NOT DETECTED

## 2012-04-26 LAB — COMPREHENSIVE METABOLIC PANEL
ALT: 10 U/L (ref 0–35)
AST: 14 U/L (ref 0–37)
Albumin: 3.5 g/dL (ref 3.5–5.2)
Alkaline Phosphatase: 95 U/L (ref 39–117)
CO2: 26 mEq/L (ref 19–32)
Chloride: 94 mEq/L — ABNORMAL LOW (ref 96–112)
Creatinine, Ser: 0.61 mg/dL (ref 0.50–1.10)
Potassium: 3.9 mEq/L (ref 3.5–5.1)
Sodium: 130 mEq/L — ABNORMAL LOW (ref 135–145)
Total Bilirubin: 0.2 mg/dL — ABNORMAL LOW (ref 0.3–1.2)

## 2012-04-26 MED ORDER — ACETAMINOPHEN 325 MG PO TABS: 650.0000 mg | ORAL_TABLET | ORAL | Status: DC | PRN

## 2012-04-26 MED ORDER — BUSPIRONE HCL 10 MG PO TABS
10.0000 mg | ORAL_TABLET | Freq: Every day | ORAL | Status: DC
  Administered 2012-04-26 – 2012-04-28 (×3): 10 mg via ORAL
  Filled 2012-04-26 (×3): qty 1

## 2012-04-26 MED ORDER — ZOLPIDEM TARTRATE 5 MG PO TABS
5.0000 mg | ORAL_TABLET | Freq: Every evening | ORAL | Status: DC | PRN
  Administered 2012-04-27 (×2): 5 mg via ORAL
  Filled 2012-04-26 (×2): qty 1

## 2012-04-26 MED ORDER — CITALOPRAM HYDROBROMIDE 40 MG PO TABS
40.0000 mg | ORAL_TABLET | Freq: Every day | ORAL | Status: DC
  Administered 2012-04-26: 40 mg via ORAL
  Filled 2012-04-26: qty 1

## 2012-04-26 MED ORDER — NICOTINE 21 MG/24HR TD PT24
21.0000 mg | MEDICATED_PATCH | Freq: Every day | TRANSDERMAL | Status: DC
  Administered 2012-04-26 – 2012-04-28 (×3): 21 mg via TRANSDERMAL
  Filled 2012-04-26 (×3): qty 1

## 2012-04-26 MED ORDER — CLONAZEPAM 0.5 MG PO TABS
0.5000 mg | ORAL_TABLET | Freq: Two times a day (BID) | ORAL | Status: DC
  Administered 2012-04-26 – 2012-04-28 (×6): 0.5 mg via ORAL
  Filled 2012-04-26 (×6): qty 1

## 2012-04-26 MED ORDER — IBUPROFEN 600 MG PO TABS: 600.0000 mg | ORAL_TABLET | Freq: Three times a day (TID) | ORAL | Status: DC | PRN

## 2012-04-26 MED ORDER — RISPERIDONE 2 MG PO TABS
3.0000 mg | ORAL_TABLET | Freq: Two times a day (BID) | ORAL | Status: DC
  Administered 2012-04-26 – 2012-04-28 (×6): 3 mg via ORAL
  Filled 2012-04-26 (×5): qty 1
  Filled 2012-04-26: qty 3

## 2012-04-26 MED ORDER — LORAZEPAM 1 MG PO TABS: 1.0000 mg | ORAL_TABLET | Freq: Three times a day (TID) | ORAL | Status: DC | PRN

## 2012-04-26 MED ORDER — CARBAMAZEPINE 200 MG PO TABS
600.0000 mg | ORAL_TABLET | Freq: Every day | ORAL | Status: DC
  Administered 2012-04-26 – 2012-04-28 (×3): 600 mg via ORAL
  Filled 2012-04-26 (×4): qty 3

## 2012-04-26 MED ORDER — CITALOPRAM HYDROBROMIDE 20 MG PO TABS
20.0000 mg | ORAL_TABLET | Freq: Every day | ORAL | Status: DC
  Administered 2012-04-26 – 2012-04-28 (×3): 20 mg via ORAL
  Filled 2012-04-26 (×3): qty 1

## 2012-04-26 MED ORDER — DULOXETINE HCL 30 MG PO CPEP
30.0000 mg | ORAL_CAPSULE | Freq: Two times a day (BID) | ORAL | Status: DC
  Administered 2012-04-26 – 2012-04-28 (×5): 30 mg via ORAL
  Filled 2012-04-26 (×5): qty 1

## 2012-04-26 NOTE — ED Notes (Signed)
telepsych completed and report tubed to EDP

## 2012-04-26 NOTE — ED Notes (Signed)
Telepsych form completed and provided to NT for RN to initiate Specialist On Call referral.  

## 2012-04-26 NOTE — ED Notes (Signed)
Telepsych completed. Pt came up to nursing station for a drink.

## 2012-04-26 NOTE — ED Provider Notes (Signed)
Medical screening examination/treatment/procedure(s) were performed by non-physician practitioner and as supervising physician I was immediately available for consultation/collaboration.   [place] Nordquist L Henrine Hayter, MD 04/26/12 0713 

## 2012-04-26 NOTE — ED Provider Notes (Addendum)
Patient continues to live. Suicidality. She states that she will put a bag over her head to kill herself. She lives in an assisted living facility. She is reportedly taking her usual psychiatric medications. I will arrange for to a psychiatric consultation to assess for psychiatric instability.  Flint Melter, MD 04/26/12 1029  She was seen by Telepsych fatty, who recommended decreasing her Celexa to 20 mg each day and, starting Cymbalta 30 mg twice a day. Also recommends admitting to an inpatient psychiatric facility.  Flint Melter, MD 04/26/12 [phone]

## 2012-04-26 NOTE — ED Notes (Signed)
Pt is eating her dinner.

## 2012-04-26 NOTE — ED Notes (Signed)
Pt laying on her bed with head propped up. Denies needs at this time.
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
## 2012-04-26 NOTE — ED Notes (Signed)
Pt's belongings moved to locker 39

## 2012-04-26 NOTE — ED Notes (Signed)
Dr Rob Bunting just called in to get details about the pt to do the Telepsych.

## 2012-04-27 NOTE — ED Notes (Signed)
Laying in bed awake

## 2012-04-27 NOTE — ED Notes (Signed)
Patient request [name] now. Made md aware of request - okay to give at this time.

## 2012-04-27 NOTE — ED Notes (Signed)
Offered patient a shower after dinner, patient refused.

## 2012-04-27 NOTE — ED Notes (Signed)
Approximately this time pt said she was done with lunch and asked for a ham sandwich and  A drink.

## 2012-04-27 NOTE — ED Notes (Signed)
Laying in bed awake, still doesn't want to watch TV, denies needs when checked on a few minutes ago

## 2012-04-27 NOTE — ED Provider Notes (Signed)
7:37 AM Filed Vitals:   04/27/12 0634  BP: 109/73  Pulse: 51  Temp: 97.6 F (36.4 C)  Resp: 18   Accepted to [facility]. Awaiting bed assignment. No complaints this AM  Lyanne Co, MD 04/27/12 [phone]

## 2012-04-27 NOTE — ED Notes (Signed)
Pt up to bathroom.

## 2012-04-27 NOTE — [facility] Counselor (Signed)
[facility] has been accepted at [facility] by Alan Watt PA pending bed availability. 

## 2012-04-27 NOTE — ED Notes (Signed)
Patient request Ambien to help her sleep.

## 2012-04-27 NOTE — ED Notes (Signed)
Pt is awake eating her breakfast 

## 2012-04-27 NOTE — ED Notes (Signed)
Up to nursing station for a coke

## 2012-04-28 NOTE — [facility] Counselor (Signed)
Patient accepted to [facility] by Dr. Elmon Kirschner to Dr. Dan Humphreys.  Room assignment is 501-2. Support paperwork completed. EDP notified and agrees to discharge patient accordingly.

## 2012-04-28 NOTE — ED Notes (Signed)
Patient seen going to bathroom.

## 2012-04-28 NOTE — ED Notes (Signed)
Patient is asleep.  

## 2012-04-28 NOTE — ED Provider Notes (Signed)
Alert, cooperative gcs 15 gait steady. Stable for transfer to [facility]  Doug Sou, MD 04/28/12 2125

## 2012-04-28 NOTE — ED Provider Notes (Signed)
No new c/o  Rebecca Hutching, MD 04/28/12 [phone]

## 2012-04-29 ENCOUNTER — Inpatient Hospital Stay (HOSPITAL_COMMUNITY)
Admission: AD | Admit: 2012-04-29 | Discharge: 2012-04-30 | DRG: 885 | Disposition: A | Discharge status: Home / Self Care | Payer: Medicare Other | Source: Ambulatory Visit | Attending: Psychiatry | Admitting: Psychiatry

## 2012-04-29 ENCOUNTER — Encounter (HOSPITAL_COMMUNITY): Payer: Self-pay | Admitting: *Deleted

## 2012-04-29 DIAGNOSIS — K219 Gastro-esophageal reflux disease without esophagitis: Secondary | ICD-10-CM | POA: Diagnosis present

## 2012-04-29 DIAGNOSIS — R45851 Suicidal ideations: Secondary | ICD-10-CM

## 2012-04-29 DIAGNOSIS — F259 Schizoaffective disorder, unspecified: Principal | ICD-10-CM | POA: Diagnosis present

## 2012-04-29 DIAGNOSIS — Z79899 Other long term (current) drug therapy: Secondary | ICD-10-CM

## 2012-04-29 DIAGNOSIS — F17200 Nicotine dependence, unspecified, uncomplicated: Secondary | ICD-10-CM | POA: Diagnosis present

## 2012-04-29 DIAGNOSIS — T424X5A Adverse effect of benzodiazepines, initial encounter: Secondary | ICD-10-CM | POA: Diagnosis present

## 2012-04-29 LAB — TSH: TSH: 2.781 u[IU]/mL (ref 0.350–4.500)

## 2012-04-29 MED ORDER — ALUM & MAG HYDROXIDE-SIMETH 200-200-20 MG/5ML PO SUSP: 30.0000 mL | ORAL | Status: DC | PRN

## 2012-04-29 MED ORDER — CARBAMAZEPINE ER 400 MG PO TB12
600.0000 mg | ORAL_TABLET | Freq: Every day | ORAL | Status: DC
  Administered 2012-04-29: 600 mg via ORAL
  Filled 2012-04-29 (×3): qty 1
  Filled 2012-04-29: qty 3

## 2012-04-29 MED ORDER — CITALOPRAM HYDROBROMIDE 40 MG PO TABS
40.0000 mg | ORAL_TABLET | Freq: Every day | ORAL | Status: DC
  Administered 2012-04-29 – 2012-04-30 (×2): 40 mg via ORAL
  Filled 2012-04-29 (×4): qty 1

## 2012-04-29 MED ORDER — ACETAMINOPHEN 325 MG PO TABS
650.0000 mg | ORAL_TABLET | Freq: Four times a day (QID) | ORAL | Status: DC | PRN
Start: 2012-04-29 — End: 2012-05-01

## 2012-04-29 MED ORDER — BUSPIRONE HCL 10 MG PO TABS
10.0000 mg | ORAL_TABLET | Freq: Three times a day (TID) | ORAL | Status: DC
  Administered 2012-04-30 (×3): 10 mg via ORAL
  Filled 2012-04-29 (×7): qty 1

## 2012-04-29 MED ORDER — RISPERIDONE 3 MG PO TABS
3.0000 mg | ORAL_TABLET | Freq: Two times a day (BID) | ORAL | Status: DC
  Administered 2012-04-29 – 2012-04-30 (×4): 3 mg via ORAL
  Filled 2012-04-29 (×7): qty 1

## 2012-04-29 MED ORDER — TRAZODONE HCL 50 MG PO TABS
50.0000 mg | ORAL_TABLET | Freq: Every evening | ORAL | Status: DC | PRN
  Administered 2012-04-29: 50 mg via ORAL
  Filled 2012-04-29 (×5): qty 1

## 2012-04-29 MED ORDER — RISPERIDONE 0.25 MG PO TABS
0.2500 mg | ORAL_TABLET | Freq: Two times a day (BID) | ORAL | Status: DC
  Administered 2012-04-29 – 2012-04-30 (×3): 0.25 mg via ORAL
  Filled 2012-04-29 (×8): qty 1

## 2012-04-29 MED ORDER — NICOTINE 21 MG/24HR TD PT24
21.0000 mg | MEDICATED_PATCH | Freq: Every day | TRANSDERMAL | Status: DC
  Administered 2012-04-29 – 2012-04-30 (×2): 21 mg via TRANSDERMAL
  Filled 2012-04-29 (×5): qty 1

## 2012-04-29 MED ORDER — MAGNESIUM HYDROXIDE 400 MG/5ML PO SUSP: 30.0000 mL | Freq: Every day | ORAL | Status: DC | PRN

## 2012-04-29 MED ORDER — BUSPIRONE HCL 10 MG PO TABS
10.0000 mg | ORAL_TABLET | Freq: Every day | ORAL | Status: DC
  Administered 2012-04-29: 10 mg via ORAL
  Filled 2012-04-29 (×3): qty 1

## 2012-04-29 MED ORDER — CARBAMAZEPINE 200 MG PO TABS
600.0000 mg | ORAL_TABLET | Freq: Every day | ORAL | Status: DC
  Filled 2012-04-29 (×3): qty 3

## 2012-04-29 NOTE — Progress Notes (Signed)
[facility] Group Notes:  (Counselor/Nursing/MHT/Case Management/Adjunct)  04/29/2012 3:00 PM  Type of Therapy: Group Therapy   Participation Level: Minimal   Participation Quality: Limited  Affect: Depressed  Cognitive: oriented, alert   Insight: minimal  Engagement in Group: Limited  Modes of Intervention: Clarification, Education, Problem-solving, Socialization, Encouragement and Support   Summary of Progress/Problems: Pt participated in group by listening attentively and self disclosing.  Therapist addressed the concept of Recovery.  Therapist prompted patients to explain their understanding of their diagnosis and identify 2 symptoms of their disease.  Therapist facilitated a discussion about the social stigma surrounding mental health.  Therapist asked patients to explain how their families understand and deal with their disease.   Pt stated that her Diagnoses were Bipolar and Schizoaffective Disorders.  She stated mood swings and hearing voices were symptoms of those diseases.  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  Pt stated that her family does not really want to understand her disease.  Therapist encouraged patients to develop and maintain strong support groups to enhance their recovery efforts.  Therapist offered support and encouragement.  Some progress noted.  Intervention effective.         Marni Griffon C 10/8//2013  3:00 PM

## 2012-04-29 NOTE — Progress Notes (Signed)
Psychoeducational Group Note  Date:  04/29/2012 Time:  2000  Group Topic/Focus:  Wrap-Up Group:   The focus of this group is to help patients review their daily goal of treatment and discuss progress on daily workbooks.  Participation Level:  Minimal  Participation Quality:  Resistant  Affect:  Depressed  Cognitive:  Appropriate  Insight:  Limited  Engagement in Group:  Limited  Additional Comments:  Patient stated that she wanted to go home tomorrow.  Artemisia Auvil, Newton Pigg 04/29/2012, 9:40 PM

## 2012-04-29 NOTE — Progress Notes (Signed)
Patient seen during d/c planning group.  She currently denies SI/HI and rates symptoms at four.  Patient advised of plans to return to [facility] at discharge.  Patient states she needs to be stabilized on meds.  Per State Regulation 482.30 This chart was reviewed for medical necessity with respect to the patient's  Admission/Duration of Stay  [facility], LCSW @10 /02/2012    Next Review Date10/11/13

## 2012-04-29 NOTE — H&P (Signed)
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  She is ordinarily not suicidal. Medical/psychiatric screening examination/treatment/procedure(s) were performed by non-physician practitioner and as supervising physician I was immediately available for consultation/collaboration.  I have seen and examined this patient and agree with the major elements of this evaluation.
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
## 2012-04-29 NOTE — Tx Team (Signed)
Initial Interdisciplinary Treatment Plan  PATIENT STRENGTHS: (choose at least two) Ability for insight Capable of independent living Motivation for treatment/growth  PATIENT STRESSORS: Health problems   PROBLEM LIST: Problem List/Patient Goals Date to be addressed Date deferred Reason deferred Estimated date of resolution  Depression 04/30/11                                                      DISCHARGE CRITERIA:  Improved stabilization in mood, thinking, and/or behavior Medical problems require only outpatient monitoring Motivation to continue treatment in a less acute level of care Reduction of life-threatening or endangering symptoms to within safe limits Verbal commitment to aftercare and medication compliance  PRELIMINARY DISCHARGE PLAN: Return to previous living arrangement  PATIENT/FAMIILY INVOLVEMENT: This treatment plan has been presented to and reviewed with the patient, [name], and/or family member.  The patient and family have been given the opportunity to ask questions and make suggestions.  [name] [facility] 04/29/2012, 1:19 AM

## 2012-04-29 NOTE — Progress Notes (Signed)
Patient ID: [name], female   DOB: August 25, 1960, 51 y.o.   MRN: [id number] D-Patient reports poor sleep last night and a good appetite.  Her energy level is low and her ability to pay attention is poor.  He rates her depression a 5 and her hopelessness a 3.  She denies thoughts of self harm.  She has been participating in groups and resting between groups.  A - Welcomed patient to unit and gave her a brief orientation. R- patient receptive.

## 2012-04-29 NOTE — Progress Notes (Signed)
[facility] Adult Inpatient Family/Significant Other Suicide Prevention Education  Suicide Prevention Education:  Education Completed;  Mr. Marijo File, Act Team Leader, ([facility]) [phone], has been identified by the patient as the contact person.  Pt will be returning to [facility], [address].  [place], [place] [id number].  Mr. Urbano Heir is in charge of providing housing for patient, and is identified as the person(s) who will aid the patient in the event of a mental health crisis (suicidal ideations/suicide attempt).  With written consent from the patient, the family member/significant other has been provided the following suicide prevention education, prior to the and/or following the discharge of the patient.  The suicide prevention education provided includes the following:  Suicide risk factors  Suicide prevention and interventions  National Suicide Hotline telephone number  [facility] assessment telephone number  [facility] Emergency Assistance 911  [facility] and/or Residential Mobile Crisis Unit telephone number  Request made of family/significant other to:  Remove weapons (e.g., guns, rifles, knives), all items previously/currently identified as safety concern.    Remove drugs/medications (over-the-counter, prescriptions, illicit drugs), all items previously/currently identified as a safety concern.  The family member/significant other verbalizes understanding of the suicide prevention education information provided.  The family member/significant other agrees to remove the items of safety concern listed above.  Mr. Urbano Heir asked that he be called with DC date.  He will arrange transportation back to the assisted living facility.   Marni Griffon C 04/29/2012, 2:37 PM

## 2012-04-29 NOTE — Progress Notes (Signed)
[facility] MD Progress Note  04/29/2012 5:59 PM  Diagnosis:   Axis I: Schizoaffective Disorder and Benzodiazepine causing adverse reaction in therapeutic use. Axis II: Deferred Axis III:  Past Medical History  Diagnosis Date  . Gastro - esophageal reflux disease   . Schizoaffective disorder   . Bipolar mood disorder   . Bipolar affective     complient with meds  . Depression    Axis IV: other psychosocial or environmental problems Axis V: 21-30 behavior considerably influenced by delusions or hallucinations OR serious impairment in judgment, communication OR inability to function in almost all areas  ADL's:  Intact  Sleep: Poor  Appetite:  Fair  Suicidal Ideation:  Pt comes into the hospital with waking up in the AM with thoughts of suicide Homicidal Ideation:  Pt denies any thoughts, plans, intent of homicide  AEB (as evidenced by):per pt report  Mental Status Examination/Evaluation: Objective:  Appearance: Casual  Eye Contact::  Good  Speech:  Clear and Coherent  Volume:  Normal  Mood:  Anxious, Depressed and Irritable  Affect:  Congruent  Thought Process:  Coherent  Orientation:  Full  Thought Content:  WDL  Suicidal Thoughts:  Yes.  without intent/plan  Homicidal Thoughts:  No  Memory:  Immediate;   Fair Recent;   Fair Remote;   Fair  Judgement:  Impaired  Insight:  Fair  Psychomotor Activity:  Normal  Concentration:  Fair  Recall:  Fair  Akathisia:  No  Handed:  Right  AIMS (if indicated):     Assets:  Communication Skills Desire for Improvement  Sleep:  Number of Hours: 4.25    Vital Signs:Blood pressure 103/73, pulse 90, temperature 97.5 F (36.4 C), resp. rate 20, height 5' 3.5" (1.613 m), weight 83.008 kg (183 lb). Current Medications: Current Facility-Administered Medications  Medication Dose Route Frequency Provider Last Rate Last Dose  . acetaminophen (TYLENOL) tablet 650 mg  650 mg Oral Q6H PRN Kerry Hough, PA      . alum & mag  hydroxide-simeth (MAALOX/MYLANTA) 200-200-20 MG/5ML suspension 30 mL  30 mL Oral Q4H PRN Kerry Hough, PA      . busPIRone (BUSPAR) tablet 10 mg  10 mg Oral TID Mike Craze, MD      . carbamazepine (TEGRETOL XR) 12 hr tablet 600 mg  600 mg Oral QHS Mike Craze, MD      . citalopram (CELEXA) tablet 40 mg  40 mg Oral Daily Kerry Hough, PA   40 mg at 04/29/12 0830  . magnesium hydroxide (MILK OF MAGNESIA) suspension 30 mL  30 mL Oral Daily PRN Kerry Hough, PA      . nicotine (NICODERM CQ - dosed in mg/24 hours) patch 21 mg  21 mg Transdermal Q0600 Kerry Hough, PA   21 mg at 04/29/12 0644  . risperiDONE (RISPERDAL) tablet 0.25 mg  0.25 mg Oral BID Mike Craze, MD      . risperiDONE (RISPERDAL) tablet 3 mg  3 mg Oral BID Kerry Hough, PA   3 mg at 04/29/12 1658  . DISCONTD: busPIRone (BUSPAR) tablet 10 mg  10 mg Oral Daily Kerry Hough, PA   10 mg at 04/29/12 0830  . DISCONTD: carbamazepine (TEGRETOL) tablet 600 mg  600 mg Oral QHS Kerry Hough, PA      . DISCONTD: traZODone (DESYREL) tablet 50 mg  50 mg Oral QHS,MR X 1 Kerry Hough, PA   50 mg at 04/29/12  0113   Facility-Administered Medications Ordered in Other Encounters  Medication Dose Route Frequency Provider Last Rate Last Dose  . DISCONTD: acetaminophen (TYLENOL) tablet 650 mg  650 mg Oral Q4H PRN Lyanne Co, MD      . DISCONTD: busPIRone (BUSPAR) tablet 10 mg  10 mg Oral Daily Lyanne Co, MD   10 mg at 04/28/12 1023  . DISCONTD: carbamazepine (TEGRETOL) tablet 600 mg  600 mg Oral QHS Lyanne Co, MD   600 mg at 04/28/12 2150  . DISCONTD: citalopram (CELEXA) tablet 20 mg  20 mg Oral Daily Flint Melter, MD   20 mg at 04/28/12 1023  . DISCONTD: clonazePAM (KLONOPIN) tablet 0.5 mg  0.5 mg Oral BID Lyanne Co, MD   0.5 mg at 04/28/12 2153  . DISCONTD: DULoxetine (CYMBALTA) DR capsule 30 mg  30 mg Oral BID Flint Melter, MD   30 mg at 04/28/12 2149  . DISCONTD: ibuprofen (ADVIL,MOTRIN) tablet  600 mg  600 mg Oral Q8H PRN Lyanne Co, MD      . DISCONTD: LORazepam (ATIVAN) tablet 1 mg  1 mg Oral Q8H PRN Lyanne Co, MD      . DISCONTD: nicotine (NICODERM CQ - dosed in mg/24 hours) patch 21 mg  21 mg Transdermal Daily Lyanne Co, MD   21 mg at 04/28/12 1023  . DISCONTD: risperiDONE (RISPERDAL) tablet 3 mg  3 mg Oral BID Lyanne Co, MD   3 mg at 04/28/12 2149  . DISCONTD: zolpidem (AMBIEN) tablet 5 mg  5 mg Oral QHS PRN Lyanne Co, MD   5 mg at 04/27/12 2213    Lab Results:  Results for orders placed during the hospital encounter of 04/29/12 (from the past 48 hour(s))  TSH     Status: Normal   Collection Time   04/29/12  6:23 AM      Component Value Range Comment   TSH 2.781  0.350 - 4.500 uIU/mL     Physical Findings: AIMS: Facial and Oral Movements Muscles of Facial Expression: None, normal Lips and Perioral Area: None, normal Jaw: None, normal Tongue: Minimal,Extremity Movements Upper (arms, wrists, hands, fingers): None, normal Lower (legs, knees, ankles, toes): None, normal, Trunk Movements Neck, shoulders, hips: None, normal, Overall Severity Severity of abnormal movements (highest score from questions above): None, normal Incapacitation due to abnormal movements: None, normal Patient's awareness of abnormal movements (rate only patient's report): No Awareness, Dental Status Current problems with teeth and/or dentures?: No Does patient usually wear dentures?: No  CIWA:    COWS:     Treatment Plan Summary: Daily contact with patient to assess and evaluate symptoms and progress in treatment Medication management No suicidal or homicidal thoughts for at least 48 hours.  Plan: Admit, continue HS Risperdal, make BuSpar TID, shift HS Tegretol to XR form, stop Klonopin and use low dose Risperdal during the day, shift to Remeron for insomnia.  Consider her to be intolerant of benzodiazepines.  Discussed the risks, benefits, and probable clinical course  with and without treatment.  Pt is agreeable to the current course of treatment.  [name] 04/29/2012, 5:59 PM

## 2012-04-29 NOTE — BHH Counselor (Signed)
Adult Comprehensive Assessment  Patient ID: [name], female   DOB: 03-Jan-1961, 51 y.o.   MRN: [id number]  Information Source: Information source: Patient  Current Stressors:  Educational / Learning stressors: None reported by pt  Employment / Job issues: None  Family Relationships: Pt reported that she would like to have a better relationship with her son  Surveyor, quantity / Lack of resources (include bankruptcy): Pts only financial resource is disability  Housing / Lack of housing: Pt reported living in assisted living but not liking it  Physical health (include injuries & life threatening diseases): None reported by pt  Social relationships: None reported by pt  Substance abuse: None reported by pt  Bereavement / Loss: None reported by pt   Living/Environment/Situation:  Living Arrangements: Other (Comment) Living conditions (as described by patient or guardian): Pt reported that she doen't really like it referring to it as "dull"  How long has patient lived in current situation?: A little over a year  What is atmosphere in current home: Comfortable  Family History:  Marital status: Divorced Divorced, when?: 2007 What types of issues is patient dealing with in the relationship?: Pt reported she only speaks to ex-husband occasionally  Additional relationship information: NA Does patient have children?: Yes How many children?: 1  How is patient's relationship with their children?: Pt reported that son is angry with her but wants to change that   Childhood History:  By whom was/is the patient raised?: Both parents Additional childhood history information: Pt reported childhood was good Description of patient's relationship with caregiver when they were a child: good Patient's description of current relationship with people who raised him/her: Deceased  Does patient have siblings?: Yes Number of Siblings: 3  Description of patient's current relationship with siblings: Pt reported  relationship is not good  Did patient suffer any verbal/emotional/physical/sexual abuse as a child?: No Did patient suffer from severe childhood neglect?: No Has patient ever been sexually abused/assaulted/raped as an adolescent or adult?: No Was the patient ever a victim of a crime or a disaster?: No Witnessed domestic violence?: No Has patient been effected by domestic violence as an adult?: No  Education:  Highest grade of school patient has completed: 1 year of college  Currently a student?: No Name of school: NA Learning disability?: No  Employment/Work Situation:   Employment situation: On disability Why is patient on disability: Bipolar  How long has patient been on disability: Since 2001  Patient's job has been impacted by current illness: Yes Describe how patient's job has been implacted: Unable to focus  What is the longest time patient has a held a job?: 12 years  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  Has patient ever been in the [employer]?: No Has patient ever served in Buyer, retail?: No  Financial Resources:   Financial resources: Insurance claims handler Does patient have a Lawyer or guardian?: Yes Name of representative payee or guardian: [facility]   Alcohol/Substance Abuse:   What has been your use of drugs/alcohol within the last 12 months?: Pt denies  If attempted suicide, did drugs/alcohol play a role in this?: No Alcohol/Substance Abuse Treatment Hx: Denies past history If yes, describe treatment: NA  Has alcohol/substance abuse ever caused legal problems?: No  Social Support System:   Conservation officer, nature Support System: Poor Describe Community Support System: Pt reported haing an ACT team  Type of faith/religion: Pt reported none How does patient's faith help to cope with current illness?: NA  Leisure/Recreation:   Leisure and Hobbies: Pt reported she used to like to [employer] and raise plants   Strengths/Needs:   What  things does the patient do well?: Pt reported gardening and cooking  In what areas does patient struggle / problems for patient: Controling depressive episodes   Discharge Plan:  Return to [facility].  ACT team at [facility].   Summary/Recommendations:   Summary and Recommendations (to be completed by the evaluator): Recommendations for treatment include crisis stabilization, case management, medication management, psycho-education to teach coping skills, and group therapy. Refer to Dr. Hortencia Pilar, ACT Team.     [facility]. 04/29/2012

## 2012-04-29 NOTE — [facility] Suicide Risk Assessment (Signed)
Suicide Risk Assessment  Admission Assessment     Nursing information obtained from:  Patient Demographic factors:  Caucasian Current Mental Status:   (Denied  during admission) Loss Factors:   (Denied) Historical Factors:  Prior suicide attempts Risk Reduction Factors:  NA  CLINICAL FACTORS:   Depression:   Anhedonia Comorbid alcohol abuse/dependence Schizoaffective disorder Previous Psychiatric Diagnoses and Treatments  COGNITIVE FEATURES THAT CONTRIBUTE TO RISK:  Thought constriction (tunnel vision)    SUICIDE RISK:   Moderate:  Frequent suicidal ideation with limited intensity, and duration, some specificity in terms of plans, no associated intent, good self-control, limited dysphoria/symptomatology, some risk factors present, and identifiable protective factors, including available and accessible social support.  Reason for hospitalization: .suicidal thoughts  Diagnosis:   Axis I: Schizoaffective Disorder and Benzodiazepine causing adverse reaction in therapeutic use. Axis II: Deferred Axis III:  Past Medical History  Diagnosis Date  . Gastro - esophageal reflux disease   . Schizoaffective disorder   . Bipolar mood disorder   . Bipolar affective     complient with meds  . Depression    Axis IV: other psychosocial or environmental problems Axis V: 21-30 behavior considerably influenced by delusions or hallucinations OR serious impairment in judgment, communication OR inability to function in almost all areas  ADL's:  Intact  Sleep: Poor  Appetite:  Fair  Suicidal Ideation:  Pt comes into the hospital with waking up in the AM with thoughts of suicide Homicidal Ideation:  Pt denies any thoughts, plans, intent of homicide  AEB (as evidenced by):per pt report  Mental Status Examination/Evaluation: Objective:  Appearance: Casual  Eye Contact::  Good  Speech:  Clear and Coherent  Volume:  Normal  Mood:  Anxious, Depressed and Irritable  Affect:  Congruent    Thought Process:  Coherent  Orientation:  Full  Thought Content:  WDL  Suicidal Thoughts:  Yes.  without intent/plan  Homicidal Thoughts:  No  Memory:  Immediate;   Fair Recent;   Fair Remote;   Fair  Judgement:  Impaired  Insight:  Fair  Psychomotor Activity:  Normal  Concentration:  Fair  Recall:  Fair  Akathisia:  No  Handed:  Right  AIMS (if indicated):     Assets:  Communication Skills Desire for Improvement  Sleep:  Number of Hours: 4.25    Vital Signs:Blood pressure 103/73, pulse 90, temperature 97.5 F (36.4 C), resp. rate 20, height 5' 3.5" (1.613 m), weight 83.008 kg (183 lb). Current Medications: Current Facility-Administered Medications  Medication Dose Route Frequency Provider Last Rate Last Dose  . acetaminophen (TYLENOL) tablet 650 mg  650 mg Oral Q6H PRN Kerry Hough, PA      . alum & mag hydroxide-simeth (MAALOX/MYLANTA) 200-200-20 MG/5ML suspension 30 mL  30 mL Oral Q4H PRN Kerry Hough, PA      . busPIRone (BUSPAR) tablet 10 mg  10 mg Oral TID Mike Craze, MD      . carbamazepine (TEGRETOL XR) 12 hr tablet 600 mg  600 mg Oral QHS Mike Craze, MD      . citalopram (CELEXA) tablet 40 mg  40 mg Oral Daily Kerry Hough, PA   40 mg at 04/29/12 0830  . magnesium hydroxide (MILK OF MAGNESIA) suspension 30 mL  30 mL Oral Daily PRN Kerry Hough, PA      . nicotine (NICODERM CQ - dosed in mg/24 hours) patch 21 mg  21 mg Transdermal Q0600 Kerry Hough, PA  21 mg at 04/29/12 0644  . risperiDONE (RISPERDAL) tablet 0.25 mg  0.25 mg Oral BID Mike Craze, MD      . risperiDONE (RISPERDAL) tablet 3 mg  3 mg Oral BID Kerry Hough, PA   3 mg at 04/29/12 1658  . DISCONTD: busPIRone (BUSPAR) tablet 10 mg  10 mg Oral Daily Kerry Hough, PA   10 mg at 04/29/12 0830  . DISCONTD: carbamazepine (TEGRETOL) tablet 600 mg  600 mg Oral QHS Kerry Hough, PA      . DISCONTD: traZODone (DESYREL) tablet 50 mg  50 mg Oral QHS,MR X 1 Kerry Hough, PA   50 mg  at 04/29/12 0113   Facility-Administered Medications Ordered in Other Encounters  Medication Dose Route Frequency Provider Last Rate Last Dose  . DISCONTD: acetaminophen (TYLENOL) tablet 650 mg  650 mg Oral Q4H PRN Lyanne Co, MD      . DISCONTD: busPIRone (BUSPAR) tablet 10 mg  10 mg Oral Daily Lyanne Co, MD   10 mg at 04/28/12 1023  . DISCONTD: carbamazepine (TEGRETOL) tablet 600 mg  600 mg Oral QHS Lyanne Co, MD   600 mg at 04/28/12 2150  . DISCONTD: citalopram (CELEXA) tablet 20 mg  20 mg Oral Daily Flint Melter, MD   20 mg at 04/28/12 1023  . DISCONTD: clonazePAM (KLONOPIN) tablet 0.5 mg  0.5 mg Oral BID Lyanne Co, MD   0.5 mg at 04/28/12 2153  . DISCONTD: DULoxetine (CYMBALTA) DR capsule 30 mg  30 mg Oral BID Flint Melter, MD   30 mg at 04/28/12 2149  . DISCONTD: ibuprofen (ADVIL,MOTRIN) tablet 600 mg  600 mg Oral Q8H PRN Lyanne Co, MD      . DISCONTD: LORazepam (ATIVAN) tablet 1 mg  1 mg Oral Q8H PRN Lyanne Co, MD      . DISCONTD: nicotine (NICODERM CQ - dosed in mg/24 hours) patch 21 mg  21 mg Transdermal Daily Lyanne Co, MD   21 mg at 04/28/12 1023  . DISCONTD: risperiDONE (RISPERDAL) tablet 3 mg  3 mg Oral BID Lyanne Co, MD   3 mg at 04/28/12 2149  . DISCONTD: zolpidem (AMBIEN) tablet 5 mg  5 mg Oral QHS PRN Lyanne Co, MD   5 mg at 04/27/12 2213    Lab Results:  Results for orders placed during the hospital encounter of 04/29/12 (from the past 48 hour(s))  TSH     Status: Normal   Collection Time   04/29/12  6:23 AM      Component Value Range Comment   TSH 2.781  0.350 - 4.500 uIU/mL     Physical Findings: AIMS: Facial and Oral Movements Muscles of Facial Expression: None, normal Lips and Perioral Area: None, normal Jaw: None, normal Tongue: Minimal,Extremity Movements Upper (arms, wrists, hands, fingers): None, normal Lower (legs, knees, ankles, toes): None, normal, Trunk Movements Neck, shoulders, hips: None, normal,  Overall Severity Severity of abnormal movements (highest score from questions above): None, normal Incapacitation due to abnormal movements: None, normal Patient's awareness of abnormal movements (rate only patient's report): No Awareness, Dental Status Current problems with teeth and/or dentures?: No Does patient usually wear dentures?: No  CIWA:    COWS:     Risk: Risk of harm to self is elevated by her depression and thought disorder.  Risk of harm to others is minimal in that she has not been involved in fights or  had any legal charges filed on her.  Treatment Plan Summary: Daily contact with patient to assess and evaluate symptoms and progress in treatment Medication management No suicidal or homicidal thoughts for at least 48 hours.  Plan: Admit, continue HS Risperdal, make BuSpar TID, shift HS Tegretol to XR form, stop Klonopin and use low dose Risperdal during the day, shift to Remeron for insomnia.  Consider her to be intolerant of benzodiazepines.  Discussed the risks, benefits, and probable clinical course with and without treatment.  Pt is agreeable to the current course of treatment. We will continue on q. 15 checks the unit protocol. At this time there is no clinical indication for one-to-one observation as patient contract for safety and presents little risk to harm themself and others.  We will increase collateral information. I encourage patient to participate in group milieu therapy. Pt will be seen in treatment team soon for further treatment and appropriate discharge planning. Please see history and physical note for more detailed information ELOS: 3 to 5 days.   Nimra Puccinelli 04/29/2012, 6:07 PM

## 2012-04-29 NOTE — ED Notes (Signed)
Discharge instructions reviewed w/ pt., going to [facility] voluntarily.

## 2012-04-29 NOTE — Progress Notes (Signed)
This is a 51 years old old Caucasian female admitted to the unit shortly after midnight for depression and suicidal ideation. Patient reported that she was having suicide thoughts for a whole day, had history of suicide attempts ; so she decided to come to the hospital. She endorsed feeling safe during admission assessment admission and denied suicide thoughts. Patient stated that she lived in an assisted living facility; "[facility]" and would be returning there at discharge. Her mood and affect sad and depressed. Surgical history of tubal ligation. She was very cooperative during admission assessment; although answered most of the question with yes/no. Q 15 minute check initiated and patient oriented to the unit.

## 2012-04-30 MED ORDER — CITALOPRAM HYDROBROMIDE 40 MG PO TABS: 40.0000 mg | ORAL_TABLET | Freq: Every day | ORAL | Status: DC

## 2012-04-30 MED ORDER — MIRTAZAPINE 15 MG PO TABS
7.5000 mg | ORAL_TABLET | Freq: Every day | ORAL | Status: DC
  Filled 2012-04-30 (×2): qty 0.5

## 2012-04-30 MED ORDER — CARBAMAZEPINE ER 200 MG PO TB12: 600.0000 mg | ORAL_TABLET | Freq: Every day | ORAL | Status: DC

## 2012-04-30 MED ORDER — BUSPIRONE HCL 10 MG PO TABS: 10.0000 mg | ORAL_TABLET | Freq: Three times a day (TID) | ORAL | Status: DC

## 2012-04-30 MED ORDER — MIRTAZAPINE 7.5 MG PO TABS: 7.5000 mg | ORAL_TABLET | Freq: Every day | ORAL | Status: DC

## 2012-04-30 MED ORDER — RISPERIDONE 3 MG PO TABS: 3.0000 mg | ORAL_TABLET | Freq: Two times a day (BID) | ORAL | Status: DC

## 2012-04-30 MED ORDER — CLONAZEPAM 0.5 MG PO TABS: ORAL_TABLET | ORAL | Status: DC

## 2012-04-30 MED ORDER — NICOTINE 21 MG/24HR TD PT24: 1.0000 | MEDICATED_PATCH | Freq: Every day | TRANSDERMAL | Status: DC

## 2012-04-30 NOTE — Progress Notes (Signed)
Pt observed in her room in bed awake.  Pt reports she is feeling better and no longer has suicidal thoughts.  She denies SI/HI/AV.  She attended evening group.  She has been pleasant/appropriate this evening.  Pt is encouraged to make her needs known to staff.  Pt voices understanding and presents no needs/concerns.  Pt plans to return to [facility] on discharge.  Safety maintained with q15 minute checks.

## 2012-04-30 NOTE — Progress Notes (Signed)
Group Note  Date:  04/30/2012 Time:  1:15  Group Topic/Focus:  Emotion Regulation  Participation Level:  None  Participation Quality:  n/a  Affect:  Flat  Cognitive:  Appropriate  Insight:  n/a  Engagement in Group:  Limited  Additional Comments:  [name] did not speak during group, but she seemed to pay attention throughout group.  [name] S 04/30/2012, 2:19 PM

## 2012-04-30 NOTE — [facility] Suicide Risk Assessment (Signed)
Suicide Risk Assessment  Discharge Assessment     Current Mental Status by Physician: Patient denies suicidal or homicidal ideation, hallucinations, illusions, or delusions. Patient engages with good eye contact, is able to focus adequately in a one to one setting, and has clear goal directed thoughts. Patient speaks with a natural conversational volume, rate, and tone. Anxiety was reported at 1 on a scale of 1 the least and 10 the most. Depression was reported at 1 on the same scale. Patient is oriented times 4, recent and remote memory intact. Judgement: improved from admission Insight: improved from admission  Demographic factors:  Caucasian Loss Factors:   (Denied) Historical Factors:  Prior suicide attempts Risk Reduction Factors:  NA  Continued Clinical Symptoms:  Depression: Anhedonia  Comorbid alcohol abuse/dependence  Schizoaffective disorder  Previous Psychiatric Diagnoses and Treatments  Discharge Diagnoses: Axis I: Schizoaffective Disorder and Benzodiazepine causing adverse reaction in therapeutic use.  Axis II: Deferred  Axis III:  Past Medical History   Diagnosis  Date   .  Gastro - esophageal reflux disease    .  Schizoaffective disorder    .  Bipolar mood disorder    .  Bipolar affective      complient with meds   .  Depression    Axis IV: other psychosocial or environmental problems  Axis V:  51-60 moderate symptoms  Cognitive Features That Contribute To Risk:  Thought constriction (tunnel vision)    Suicide Risk:  Minimal: No identifiable suicidal ideation.  Patients presenting with no risk factors but with morbid ruminations; may be classified as minimal risk based on the severity of the depressive symptoms  Labs:  Results for orders placed during the hospital encounter of 04/29/12 (from the past 72 hour(s))  TSH     Status: Normal   Collection Time   04/29/12  6:23 AM      Component Value Range Comment   TSH 2.781  0.350 - 4.500 uIU/mL   TSH      Status: Abnormal   Collection Time   04/30/12  6:10 AM      Component Value Range Comment   TSH 5.057 (*) 0.350 - 4.500 uIU/mL    RISK REDUCTION FACTORS: What pt has learned from hospital stay is how to cope with life better, by finding the positive in everything.  Risk of self harm is elevated by their prior attempts, mood and thought disorder as well as the use of abusable substances, but they have concluded that they have themselves to live for.  Risk of harm to others is minimal in that she has not been involved in fights or had any legal charges filed on her.  Pt seen in treatment team where she divulged the above information. The treatment team concluded that she was ready for discharge and had met her goals for an inpatient setting.  PLAN: Discharge home Continue   Medication List     As of 04/30/2012  1:02 PM    STOP taking these medications         carbamazepine 200 MG tablet   Commonly known as: TEGRETOL      zolpidem 10 MG tablet   Commonly known as: AMBIEN      TAKE these medications      Indication    busPIRone 10 MG tablet   Commonly known as: BUSPAR   Take 1 tablet (10 mg total) by mouth 3 (three) times daily. For anxiety without causing disinhibition or depression  carbamazepine 200 MG 12 hr tablet   Commonly known as: TEGRETOL XR   Take 3 tablets (600 mg total) by mouth at bedtime. For mood control.       citalopram 40 MG tablet   Commonly known as: CELEXA   Take 1 tablet (40 mg total) by mouth daily. For depression.       clonazePAM 0.5 MG tablet   Commonly known as: KLONOPIN   Caused disinhibition and depression as well as suicide thoughts DO NOT TAKE again       nicotine 21 mg/24hr patch   Commonly known as: NICODERM CQ - dosed in mg/24 hours   Place 1 patch onto the skin daily at 6 (six) AM. For smoking cessation.       risperiDONE 3 MG tablet   Commonly known as: RISPERDAL   Take 1 tablet (3 mg total) by mouth 2 (two) times daily. For  mood control.        Follow-up recommendations:  Activities: Resume typical activities Diet: Resume typical diet Tests: none Other: Follow up with outpatient provider and report any side effects to out patient prescriber.  Is patient on multiple antipsychotic therapies at discharge:  No  Has Patient had three or more failed trials of antipsychotic monotherapy by history: N/A Recommended Plan for Multiple Antipsychotic Therapies: N/A  Dan Humphreys, Tinia Oravec 04/30/2012 1:02 PM

## 2012-04-30 NOTE — Progress Notes (Signed)
D: Patient denies SI. The patient has an anxious mood and affect. The patient reports sleeping fairly well and states that her appetite and energy level are normal. The patient rates her depression and hopelessness both a 2 out of 10 (1 low/10 high). The patient is attending groups and is interacting appropriately within the milieu.   A: Patient given emotional support from RN. Patient encouraged to come to staff with concerns and/or questions. Patient's medication routine continued. Patient's orders and plan of care reviewed.  R: Patient remains appropriate and cooperative. Will continue to monitor patient q15 minutes for safety.

## 2012-04-30 NOTE — Tx Team (Signed)
Interdisciplinary Treatment Plan Update (Adult)  Date:  04/30/2012  Time Reviewed:  9:44 AM   Progress in Treatment: Attending groups: Yes Participating in groups:  Yes Taking medication as prescribed:  Yes Tolerating medication: Yes Family/Significant othe contact made:  Yes, contact made with:Court Trueblood Patient understands diagnosis: Yes Discussing patient identified problems/goals with staff:  Yes Medical problems stabilized or resolved: Yes Denies suicidal/homicidal ideation: Yes Issues/concerns per patient self-inventory:  No  Other:  New problem(s) identified: None  Reason for Continuation of Hospitalization: Appropriate for discharge today  Interventions implemented related to continuation of hospitalization:  Medication stabilization, safety checks q 15 mins, group attendance  Additional comments:  Estimated length of stay: discharge today  Discharge Plan: [name] will discharge back to [facility] and will follow up with [facility] ACT Team  New goal(s):  Review of initial/current patient goals per problem list:   1.  Goal(s): Decrease depressive symptoms to a rating of 4 or less  Met:  Yes  Target date: by discharge  As evidenced by: Azaryah rates depression at 1 today  2.  Goal (s): Decrease anxiety symptoms to a rating of 4 or less  Met:  Yes  Target date: by discharge  As evidenced by: Noor rates anxiety at 1 today  3.  Goal(s): Reduce potential for suicide/self-harm  Met:  Yes  Target date: by discharge  As evidenced by: Ellen denies any thoughts of suicide   Attendees: Patient:  [name] 04/30/2012 9:44 AM  Family:     Physician:  Dr Orson Aloe, MD 04/30/2012 9:44 AM  Nursing:  Nestor Ramp, RN 04/30/2012 9:44 AM  Case Manager:  Juline Patch, LCSW 04/30/2012 9:44 AM  Counselor:  Angus Palms, LCSW 04/30/2012 9:44 AM  Other:  Berneice Heinrich, RN 04/30/2012 9:44 AM  Other:  Harold Barban, RN 04/30/2012 9:44 AM  Other:  Onnie Boer, RN Case  Manager 04/30/2012 9:44 AM  Other:      Scribe for Treatment Team:   Billie Lade, 04/30/2012 9:44 AM

## 2012-04-30 NOTE — Progress Notes (Signed)
Psychoeducational Group Note  Date:  04/30/2012 Time:  1100  Group Topic/Focus:  Personal Choices and Values:   The focus of this group is to help patients assess and explore the importance of values in their lives, how their values affect their decisions, how they express their values and what opposes their expression.  Participation Level:  Active  Participation Quality:  Appropriate, Sharing and Supportive  Affect:  Appropriate  Cognitive:  Appropriate  Insight:  Good  Engagement in Group:  Good  Additional Comments:  none  Stephon Weathers M 04/30/2012, 6:35 PM

## 2012-04-30 NOTE — Progress Notes (Signed)
Psychoeducational Group Note  Date:  04/30/2012 Time:  1000  Group Topic/Focus:  Personal Development- Wisdom card activitiy  Participation Level:  Active  Participation Quality:  Appropriate, Attentive and Sharing  Affect:  Appropriate  Cognitive:  Appropriate  Insight:  Good  Engagement in Group:  Good  Additional Comments:  Pt attended group and was given two cards related to wisdom. Pt used two cards to explain to group setting about how the card applied in personal life and how the card could be used as a positive factor. Pt cards stated see good in everything and become better. Pt stated the cards impact her life because she needs to become more positive about how she feels about herself.    Karleen Hampshire Brittini 04/30/2012, 12:55 PM

## 2012-04-30 NOTE — Progress Notes (Signed)
[facility] Case Management Discharge Plan:  Will you be returning to the same living situation after discharge: Yes,  Patient is returning to [facility] At discharge, do you have transportation home?:Yes,  [facility] staff to transport patient back to facility Do you have the ability to pay for your medications:Yes,  Patient has Medicare and Medicaid  Interagency Information:     Release of information consent forms completed and in the chart;  Patient's signature needed at discharge.  Patient to Follow up at:  Follow-up Information    Follow up with [facility]. On 05/01/2012. (You will be seen by [facility] on Thursday, May 01, 2012 at 11:00 AM)    Contact information:   [address] [place], [place]  [address]  [phone]         Patient denies SI/HI:   Yes,  Patient is no longer endorsing SI/HI or other thoughts of self harm    Safety Planning and Suicide Prevention discussed:  Yes,  Reviewed with patient individually  Barrier to discharge identified:No.  Summary and Recommendations: Patient encouraged to be compliant with medications and to follow up with all outpatient recommendations.    [name] 04/30/2012, 12:23 PM

## 2012-04-30 NOTE — Progress Notes (Signed)
Pt discharged per MD orders; pt currently denies SI/HI and auditory/visual hallucinations; pt was given education by RN regarding follow-up appointments and medications and pt denied any questions or concerns about these instructions; pt was then escorted to search room to retrieve her belongings by RN before being discharged to hospital lobby. 

## 2012-05-01 NOTE — Progress Notes (Signed)
Patient Discharge Instructions:  After Visit Summary (AVS):   Faxed to:  05/01/12 Discharge Summary Note:   Faxed to:  05/01/12 Psychiatric Admission Assessment Note:   Faxed to:  05/01/12 Suicide Risk Assessment - Discharge Assessment:   Faxed to:  05/01/12 Faxed/Sent to the [facility] Care provider:  05/01/12  Faxed to [facility] @ [phone]  Karleen Hampshire Brittini, 05/01/2012, 4:17 PM

## 2012-05-02 ENCOUNTER — Emergency Department (HOSPITAL_COMMUNITY)
Admission: EM | Admit: 2012-05-02 | Discharge: 2012-05-04 | Disposition: A | Discharge status: Home / Self Care | Payer: Medicare Other | Attending: Emergency Medicine | Admitting: Emergency Medicine

## 2012-05-02 ENCOUNTER — Encounter (HOSPITAL_COMMUNITY): Payer: Self-pay | Admitting: Emergency Medicine

## 2012-05-02 DIAGNOSIS — Z79899 Other long term (current) drug therapy: Secondary | ICD-10-CM | POA: Insufficient documentation

## 2012-05-02 DIAGNOSIS — F329 Major depressive disorder, single episode, unspecified: Secondary | ICD-10-CM

## 2012-05-02 DIAGNOSIS — F259 Schizoaffective disorder, unspecified: Secondary | ICD-10-CM | POA: Insufficient documentation

## 2012-05-02 LAB — RAPID URINE DRUG SCREEN, HOSP PERFORMED
Barbiturates: NOT DETECTED
Benzodiazepines: NOT DETECTED
Cocaine: NOT DETECTED

## 2012-05-02 NOTE — ED Notes (Signed)
Pt alert, arrives from [facility], c/o SI, states "I was in [facility], my thoughts subsided, now they have returned", states plan to "put a bag over my head", resp even unlabored, skin pwd

## 2012-05-02 NOTE — ED Notes (Signed)
Pt seen and searched by security.  Pt changed into scrubs.  Belongings placed in locker 3 triage.  1 bag.

## 2012-05-02 NOTE — ED Notes (Signed)
Pt was asked to urinate and could not. Will try again in 30 minutes.

## 2012-05-03 LAB — COMPREHENSIVE METABOLIC PANEL
ALT: 20 U/L (ref 0–35)
BUN: 7 mg/dL (ref 6–23)
CO2: 30 mEq/L (ref 19–32)
Calcium: 9.4 mg/dL (ref 8.4–10.5)
Creatinine, Ser: 0.65 mg/dL (ref 0.50–1.10)
GFR calc Af Amer: >90 mL/min (ref >90)
GFR calc non Af Amer: >90 mL/min (ref >90)
Glucose, Bld: 145 mg/dL — ABNORMAL HIGH (ref 70–99)
Total Protein: 7.1 g/dL (ref 6.0–8.3)

## 2012-05-03 LAB — ETHANOL: Alcohol, Ethyl (B): <11 mg/dL (ref 0–11)

## 2012-05-03 LAB — CBC
HCT: 46.3 % — ABNORMAL HIGH (ref 36.0–46.0)
Hemoglobin: 17.5 g/dL — ABNORMAL HIGH (ref 12.0–15.0)
MCH: 32.4 pg (ref 26.0–34.0)
MCHC: 35.6 g/dL (ref 30.0–36.0)
MCV: 91.3 fL (ref 78.0–100.0)
RBC: 5.07 MIL/uL (ref 3.87–5.11)

## 2012-05-03 MED ORDER — MIRTAZAPINE 7.5 MG PO TABS
7.5000 mg | ORAL_TABLET | Freq: Every day | ORAL | Status: DC
  Administered 2012-05-03: 7.5 mg via ORAL
  Filled 2012-05-03 (×2): qty 1

## 2012-05-03 MED ORDER — BUSPIRONE HCL 10 MG PO TABS
10.0000 mg | ORAL_TABLET | Freq: Three times a day (TID) | ORAL | Status: DC
  Administered 2012-05-03 – 2012-05-04 (×5): 10 mg via ORAL
  Filled 2012-05-03 (×5): qty 1

## 2012-05-03 MED ORDER — ACETAMINOPHEN 325 MG PO TABS: 650.0000 mg | ORAL_TABLET | ORAL | Status: DC | PRN

## 2012-05-03 MED ORDER — NICOTINE 21 MG/24HR TD PT24
21.0000 mg | MEDICATED_PATCH | Freq: Every day | TRANSDERMAL | Status: DC
  Administered 2012-05-03 – 2012-05-04 (×2): 21 mg via TRANSDERMAL
  Filled 2012-05-03 (×2): qty 1

## 2012-05-03 MED ORDER — RISPERIDONE 2 MG PO TABS
3.0000 mg | ORAL_TABLET | Freq: Two times a day (BID) | ORAL | Status: DC
  Administered 2012-05-03 – 2012-05-04 (×3): 3 mg via ORAL
  Filled 2012-05-03: qty 3
  Filled 2012-05-03 (×3): qty 1

## 2012-05-03 MED ORDER — ONDANSETRON HCL 4 MG/2ML IJ SOLN
4.0000 mg | Freq: Once | INTRAMUSCULAR | Status: AC
  Administered 2012-05-03: 4 mg via INTRAMUSCULAR
  Filled 2012-05-03: qty 2

## 2012-05-03 MED ORDER — CARBAMAZEPINE ER 400 MG PO TB12
600.0000 mg | ORAL_TABLET | Freq: Every day | ORAL | Status: DC
  Administered 2012-05-03: 600 mg via ORAL
  Filled 2012-05-03 (×2): qty 1

## 2012-05-03 MED ORDER — CITALOPRAM HYDROBROMIDE 40 MG PO TABS
40.0000 mg | ORAL_TABLET | Freq: Every day | ORAL | Status: DC
  Administered 2012-05-03 – 2012-05-04 (×2): 40 mg via ORAL
  Filled 2012-05-03 (×2): qty 1

## 2012-05-03 NOTE — ED Provider Notes (Signed)
Medical screening examination/treatment/procedure(s) were performed by non-physician practitioner and as supervising physician I was immediately available for consultation/collaboration.  Izabelle Daus T Nygel Prokop, MD 05/03/12 0729 

## 2012-05-03 NOTE — ED Notes (Signed)
Eating her dinner

## 2012-05-03 NOTE — ED Provider Notes (Signed)
History     CSN: [id number]  Arrival date & time 05/02/12  2244   First MD Initiated Contact with Patient 05/02/12 2346      Chief Complaint  Patient presents with  . Medical Clearance    (Consider location/radiation/quality/duration/timing/severity/associated sxs/prior treatment) Patient is a 51 y.o. female presenting with mental health disorder. The history is provided by the patient.  Mental Health Problem The primary symptoms include dysphoric mood. The current episode started today.  The degree of incapacity that she is experiencing as a consequence of her illness is mild. Additional symptoms of the illness do not include no abdominal pain. Associated symptoms comments: She reports recurrent SI shortly after returning to her assisted living facility from most recent hospitalization for same, discharged yesterday. She plans to put a bag over her head and suffocate herself. She had nausea only after arrival to the ED tonight that resolved with one episode emesis and states that "I ate a hamburger for dinner that didn't sit right on my stomach". Otherwise no physical complaints..    Past Medical History  Diagnosis Date  . Gastro - esophageal reflux disease   . Schizoaffective disorder   . Bipolar mood disorder   . Bipolar affective     complient with meds  . Depression     Past Surgical History  Procedure Date  . Tubal ligation     Family History  Problem Relation Age of Onset  . Diabetes Mother   . Heart failure Father     History  Substance Use Topics  . Smoking status: Current Every Day Smoker -- 0.5 packs/day    Types: Cigarettes  . Smokeless tobacco: Not on file  . Alcohol Use: No    OB History    Grav Para Term Preterm Abortions TAB SAB Ect Mult Living                  Review of Systems  Constitutional: Negative for fever.  Respiratory: Negative for shortness of breath.   Cardiovascular: Negative for chest pain.  Gastrointestinal: Positive for  nausea and vomiting. Negative for abdominal pain.  Psychiatric/Behavioral: Positive for suicidal ideas and dysphoric mood.    Allergies  Benzodiazepines and Penicillins  Home Medications   Current Outpatient Rx  Name Route Sig Dispense Refill  . BUSPIRONE HCL 10 MG PO TABS Oral Take 1 tablet (10 mg total) by mouth 3 (three) times daily. For anxiety without causing disinhibition or depression 90 tablet 0  . CARBAMAZEPINE ER 200 MG PO TB12 Oral Take 3 tablets (600 mg total) by mouth at bedtime. For mood control. 90 tablet 0  . CITALOPRAM HYDROBROMIDE 40 MG PO TABS Oral Take 1 tablet (40 mg total) by mouth daily. For depression. 30 tablet 0  . CLONAZEPAM 0.5 MG PO TABS  Caused disinhibition and depression as well as suicide thoughts DO NOT TAKE again 0.0001 tablet 0  . MIRTAZAPINE 7.5 MG PO TABS Oral Take 1 tablet (7.5 mg total) by mouth at bedtime. For insomnia. 15 tablet 0  . NICOTINE 21 MG/24HR TD PT24 Transdermal Place 1 patch onto the skin daily at 6 (six) AM. For smoking cessation. 28 patch 0  . RISPERIDONE 3 MG PO TABS Oral Take 1 tablet (3 mg total) by mouth 2 (two) times daily. For mood control. 60 tablet 0    BP 104/69  Pulse 87  Temp 98.1 F (36.7 C) (Oral)  Resp 20  Wt 184 lb 6 oz (83.632 kg)  SpO2 95%  Physical Exam  Constitutional: She is oriented to person, place, and time. She appears well-developed and well-nourished.  HENT:  Head: Normocephalic.  Neck: Normal range of motion. Neck supple.  Cardiovascular: Normal rate and regular rhythm.   Pulmonary/Chest: Effort normal and breath sounds normal.  Abdominal: Soft. Bowel sounds are normal. There is no tenderness. There is no rebound and no guarding.  Musculoskeletal: Normal range of motion.  Neurological: She is alert and oriented to person, place, and time.  Skin: Skin is warm and dry. No rash noted.  Psychiatric: She has a normal mood and affect.    ED Course  Procedures (including critical care time)  Labs  Reviewed  CBC - Abnormal; Notable for the following:    Hemoglobin 17.5 (*)     HCT 46.3 (*)     All other components within normal limits  COMPREHENSIVE METABOLIC PANEL - Abnormal; Notable for the following:    Sodium 133 (*)     Chloride 91 (*)     Glucose, Bld 145 (*)     All other components within normal limits  ETHANOL  URINE RAPID DRUG SCREEN (HOSP PERFORMED)   Results for orders placed during the hospital encounter of 05/02/12  CBC      Component Value Range   WBC 10.2  4.0 - 10.5 K/uL   RBC 5.07  3.87 - 5.11 MIL/uL   Hemoglobin 17.5 (*) 12.0 - 15.0 g/dL   HCT 16.1 (*) 09.6 - 04.5 %   MCV 91.3  78.0 - 100.0 fL   MCH 32.4  26.0 - 34.0 pg   MCHC 35.6  30.0 - 36.0 g/dL   RDW 40.9  81.1 - 91.4 %   Platelets 264  150 - 400 K/uL  COMPREHENSIVE METABOLIC PANEL      Component Value Range   Sodium 133 (*) 135 - 145 mEq/L   Potassium 3.6  3.5 - 5.1 mEq/L   Chloride 91 (*) 96 - 112 mEq/L   CO2 30  19 - 32 mEq/L   Glucose, Bld 145 (*) 70 - 99 mg/dL   BUN 7  6 - 23 mg/dL   Creatinine, Ser 7.82  0.50 - 1.10 mg/dL   Calcium 9.4  8.4 - 95.6 mg/dL   Total Protein 7.1  6.0 - 8.3 g/dL   Albumin 3.6  3.5 - 5.2 g/dL   AST 18  0 - 37 U/L   ALT 20  0 - 35 U/L   Alkaline Phosphatase 97  39 - 117 U/L   Total Bilirubin 0.3  0.3 - 1.2 mg/dL   GFR calc non Af Amer >90  >90 mL/min   GFR calc Af Amer >90  >90 mL/min  ETHANOL      Component Value Range   Alcohol, Ethyl (B) <11  0 - 11 mg/dL  URINE RAPID DRUG SCREEN (HOSP PERFORMED)      Component Value Range   Opiates NONE DETECTED  NONE DETECTED   Cocaine NONE DETECTED  NONE DETECTED   Benzodiazepines NONE DETECTED  NONE DETECTED   Amphetamines NONE DETECTED  NONE DETECTED   Tetrahydrocannabinol NONE DETECTED  NONE DETECTED   Barbiturates NONE DETECTED  NONE DETECTED    No results found.   No diagnosis found.    MDM  Patient just discharged after psychiatric admission for suicidal ideation. She appears well, with normal mood  and thought content, and would benefit from telepsych evaluation to determine the need for re-admission.  Rodena Medin, PA-C 05/03/12 0159

## 2012-05-03 NOTE — ED Notes (Signed)
Pt up to nursing station asking for a coke

## 2012-05-03 NOTE — ED Notes (Signed)
Pt walking the hall with her coffee, looking in other pt windows.  Redirected pt to her room.  She asked if she could take a shower and I explained that her nurse said she could take a shower after her tele-psych exam.

## 2012-05-03 NOTE — ED Notes (Signed)
Pt reports she doesn't really know what's making her suicidal. When asked she says she doesn't like living at [facility] and has asked her Case Mgr to find her somewhere else to live and they are working on it. When asked if being at [facility] and not liking it there makes her suicidal she said a little bit. Pt remains suicidal at this time. Talked with pt about it not being healthy to drink sodas all the time and that today she needs to drink some water and juice as well. Pt agreed.

## 2012-05-03 NOTE — ED Provider Notes (Addendum)
Patient states she still feels suicidal this morning. Denies any new medical issues at this time. Filed Vitals:   05/03/12 0628  BP: 98/63  Pulse: 78  Temp: 98.3 F (36.8 C)  Resp: 18     The patient is medically stable awaiting psychiatric placement.  Home meds via med rec ordered.  Celene Kras, MD 05/03/12 1610  Celene Kras, MD 05/03/12 [phone]

## 2012-05-03 NOTE — BH Assessment (Signed)
Assessment Note   [name] is an 51 y.o. female.  Pt was recently hospitalized for suicidal ideations and discharged from the hospital 05/01/12. Shortly after returning to her assisted living facility [facility]) patient begain to feel suicidal again. Pt sent to [facility] from [facility] to be evaluated.  Pt reported recurrent SI's for the past 2 days with plans to put a bag over her head and suffocate herself, per ED notes this am. Patient was unable to indentify any triggers for her suicidal thoughts. She also has a history of reaccuring suicidal thoughts.  During today's [facility] assessment patient denies suicidal thoughts. She no longer has a plan to harm herself and she is able to contract for safety. No HI. No current psychosis but patient admits to a history of auditory hallucinations.     Axis I: Bipolar, Depressed Axis II: Deferred Axis III:  Past Medical History  Diagnosis Date  . Gastro - esophageal reflux disease   . Schizoaffective disorder   . Bipolar mood disorder   . Bipolar affective     complient with meds  . Depression    Axis IV: other psychosocial or environmental problems, problems related to social environment and problems with primary support group Axis V: 51-60 moderate symptoms  Past Medical History:  Past Medical History  Diagnosis Date  . Gastro - esophageal reflux disease   . Schizoaffective disorder   . Bipolar mood disorder   . Bipolar affective     complient with meds  . Depression     Past Surgical History  Procedure Date  . Tubal ligation     Family History:  Family History  Problem Relation Age of Onset  . Diabetes Mother   . Heart failure Father     Social History:  reports that she has been smoking Cigarettes.  She has been smoking about .5 packs per day. She does not have any smokeless tobacco history on file. She reports that she does not drink alcohol or use illicit drugs.  Additional Social History:  Alcohol / Drug Use Pain  Medications: None Reported Prescriptions: See MAR Over the Counter: None Reported History of alcohol / drug use?: No history of alcohol / drug abuse Longest period of sobriety (when/how long): unknown  CIWA: CIWA-Ar BP: 98/63 mmHg Pulse Rate: 78  COWS:    Allergies:  Allergies  Allergen Reactions  . Benzodiazepines Other (See Comments)    Suicidal ideation on Klonopin  . Penicillins Rash    Home Medications:  (Not in a hospital admission)  OB/GYN Status:  No LMP recorded. Patient is postmenopausal.  General Assessment Data Location of Assessment: [facility] Living Arrangements: Other (Comment) (lives at [facility] (resided their 1 yr)) Can pt return to current living arrangement?:  (unknown; staff will contact facility for a answer) Admission Status: Voluntary Is patient capable of signing voluntary admission?: Yes Transfer from: Acute Hospital Referral Source: Self/Family/Friend  Education Status Is patient currently in school?: No  Risk to self Suicidal Ideation: No Suicidal Intent:  (No current SI; pt had thouhts yesterday and day before) Is patient at risk for suicide?: No Suicidal Plan?: No (no current plan; yesterday pt wanted to suffocate self) Specify Current Suicidal Plan:  (yesterday and day before pt wanted to put plastic bad on hea) Access to Means: Yes Specify Access to Suicidal Means:  (plastic bags at [facility]) What has been your use of drugs/alcohol within the last 12 months?:  (pt denies alcohol and drug use)  Previous Attempts/Gestures: No How many times?:  (3 prior attempts:jump off  2nd story window & 2 OD's) Other Self Harm Risks:  (none reported) Triggers for Past Attempts: Unpredictable Intentional Self Injurious Behavior: None Family Suicide History:  (Brother was diagnosed with Bipolar) Recent stressful life event(s):  (none reported; pt denies any stressors today or recently) Persecutory voices/beliefs?: No Depression: No Depression  Symptoms: Feeling worthless/self pity Substance abuse history and/or treatment for substance abuse?: No Suicide prevention information given to non-admitted patients: Not applicable  Risk to Others Homicidal Ideation: No Thoughts of Harm to Others: No Current Homicidal Intent: No Current Homicidal Plan: No Access to Homicidal Means: No Identified Victim:  (n/a) History of harm to others?: No Assessment of Violence: None Noted Violent Behavior Description:  (none reported; pt has remained calm and cooperative ) Does patient have access to weapons?: No Criminal Charges Pending?: No Does patient have a court date: No  Psychosis Hallucinations: None noted (pt admits to a history of hearing voices; no curent hallucin) Delusions: None noted  Mental Status Report Appear/Hygiene: Disheveled Eye Contact: Good Motor Activity: Freedom of movement Speech: Logical/coherent Level of Consciousness: Alert Mood: Depressed Affect: Anxious;Appropriate to circumstance Anxiety Level: Minimal Thought Processes: Coherent Judgement: Unimpaired Orientation: Person;Place;Time;Situation Obsessive Compulsive Thoughts/Behaviors: None  Cognitive Functioning Concentration: Normal Memory: Recent Intact;Remote Intact IQ: Average Insight: Good Impulse Control: Good Appetite: Good Weight Loss:  (none reported) Weight Gain:  (none reported) Sleep: No Change Total Hours of Sleep:  (8 hrs per night) Vegetative Symptoms: None  ADLScreening [facility] Assessment Services) Patient's cognitive ability adequate to safely complete daily activities?: Yes Patient able to express need for assistance with ADLs?: Yes Independently performs ADLs?: Yes (appropriate for developmental age)  Abuse/Neglect [facility]) Physical Abuse: Denies Verbal Abuse: Denies Sexual Abuse: Denies  Prior Inpatient Therapy Prior Inpatient Therapy: Yes Prior Therapy Dates:  ([facility]-(last 2011), [facility]2009,[facility], [facility]  ) Prior Therapy Facilty/Provider(s): [facility], [facility], [facility], [facility], [facility]  Reason for Treatment: SI/Depression   Prior Outpatient Therapy Prior Outpatient Therapy: Yes Prior Therapy Dates: Current  Prior Therapy Facilty/Provider(s): Dr. Hortencia Pilar; [facility]  Reason for Treatment: Med Mgt, Therapy   ADL Screening (condition at time of admission) Patient's cognitive ability adequate to safely complete daily activities?: Yes Patient able to express need for assistance with ADLs?: Yes Independently performs ADLs?: Yes (appropriate for developmental age) Weakness of Legs: None Weakness of Arms/Hands: None  Home Assistive Devices/Equipment Home Assistive Devices/Equipment: None    Abuse/Neglect Assessment (Assessment to be complete while patient is alone) Physical Abuse: Denies Verbal Abuse: Denies Sexual Abuse: Denies Exploitation of patient/patient's resources: Denies Self-Neglect: Denies Values / Beliefs Cultural Requests During Hospitalization: None Spiritual Requests During Hospitalization: None   Advance Directives (For Healthcare) Advance Directive: Patient does not have advance directive Nutrition Screen- [facility] Patient's home diet: Regular  Additional Information 1:1 In Past 12 Months?: No CIRT Risk: No Elopement Risk: No Does patient have medical clearance?: Yes     Disposition:  Disposition Disposition of Patient: Other dispositions (Disposition pending telepsych consult)  On Site Evaluation by:   Reviewed with Physician:     Octaviano Batty 05/03/2012 11:35 AM

## 2012-05-03 NOTE — ED Notes (Signed)
Patient is  Asleep, resting comfortably.Breathing WNL

## 2012-05-04 NOTE — BHH Counselor (Signed)
Patient has been declined at [facility] by Verne Spurr PA due to patient does not meet criteria for inpatient treatment. Patient needs social work consult and needs to have her community ACT team contacted concerning her issues with current housing placement.

## 2012-05-04 NOTE — ED Notes (Signed)
Patient is resting comfortably. 

## 2012-05-04 NOTE — ED Notes (Signed)
Patient is resting comfortably. Stretcher locked in lowest positon,  Breathing WNL.

## 2012-05-04 NOTE — ED Provider Notes (Signed)
Cleared by Telepsych for discharge; Pt agrees.  Hurman Horn, MD 05/05/12 [phone]

## 2012-05-04 NOTE — ED Provider Notes (Signed)
Patient to have repeat telemetry psychiatry consult today. Disposition is pending  Toy Baker, MD 05/04/12 1000

## 2012-05-04 NOTE — ED Notes (Signed)
Talked to [place] at [facility], she just wanted to know if there had been any medication changes on pt which there weren't so she said ok, i'm expecting her to return.

## 2012-05-04 NOTE — ED Notes (Signed)
Pt just laying down in her bed resting. Explained to her she will talk to psychiatrist on the monitor again today since she is no longer suicidal and is ready to go back to [facility]

## 2012-05-04 NOTE — ED Notes (Signed)
[facility] here to pick up pt and take her to [facility]

## 2012-05-04 NOTE — ED Notes (Signed)
telepsych info faxed/called into SOC 

## 2012-05-04 NOTE — ED Notes (Signed)
Spoke to [facility] [place] to arrange transportation for pt back to [facility].

## 2012-05-04 NOTE — ED Notes (Signed)
Pt came to window to see when she gets to leave. Informed that she has to wait until [facility] is contacted and the MD agrees with the Telepsych recommendations so it will be awhile.

## 2012-05-04 NOTE — ED Notes (Addendum)
Pt reports that she's no longer suicidal though she can't give a specific reason as to why it's changed. She reports the staff at [facility] are good to her and she's ready to return to [facility] now. Pt had good eye contact and her baseline seems to be flat. She denies SI/HI/AVH

## 2012-05-04 NOTE — ED Notes (Signed)
Pt laying in her bed awake

## 2012-05-04 NOTE — ED Notes (Signed)
Per Reita Cliche, CSW ACT, Arcelia Jew at [facility] confirmed pt can return to the facility. Per Alexia Freestone, charge RN, pt can return via [facility]. [facility] [address] [phone]. [facility] [phone].

## 2012-05-04 NOTE — ED Notes (Signed)
Patient is asleep, resting comfortably. Breathing WNL.    

## 2012-05-04 NOTE — ED Notes (Addendum)
Patient is resting comfortably. Breathing WNL.  
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
## 2012-09-09 ENCOUNTER — Emergency Department (HOSPITAL_COMMUNITY): Payer: Medicare Other

## 2012-09-09 ENCOUNTER — Encounter (HOSPITAL_COMMUNITY): Payer: Self-pay

## 2012-09-09 ENCOUNTER — Emergency Department (HOSPITAL_COMMUNITY)
Admission: EM | Admit: 2012-09-09 | Discharge: 2012-09-09 | Disposition: A | Discharge status: Home / Self Care | Payer: Medicare Other | Attending: Emergency Medicine | Admitting: Emergency Medicine

## 2012-09-09 DIAGNOSIS — S92343A Displaced fracture of fourth metatarsal bone, unspecified foot, initial encounter for closed fracture: Secondary | ICD-10-CM

## 2012-09-09 DIAGNOSIS — F3289 Other specified depressive episodes: Secondary | ICD-10-CM | POA: Insufficient documentation

## 2012-09-09 DIAGNOSIS — F259 Schizoaffective disorder, unspecified: Secondary | ICD-10-CM | POA: Insufficient documentation

## 2012-09-09 DIAGNOSIS — Z79899 Other long term (current) drug therapy: Secondary | ICD-10-CM | POA: Insufficient documentation

## 2012-09-09 DIAGNOSIS — S92309A Fracture of unspecified metatarsal bone(s), unspecified foot, initial encounter for closed fracture: Secondary | ICD-10-CM | POA: Insufficient documentation

## 2012-09-09 DIAGNOSIS — Y9289 Other specified places as the place of occurrence of the external cause: Secondary | ICD-10-CM | POA: Insufficient documentation

## 2012-09-09 DIAGNOSIS — S79929A Unspecified injury of unspecified thigh, initial encounter: Secondary | ICD-10-CM | POA: Insufficient documentation

## 2012-09-09 DIAGNOSIS — F17200 Nicotine dependence, unspecified, uncomplicated: Secondary | ICD-10-CM | POA: Insufficient documentation

## 2012-09-09 DIAGNOSIS — W010XXA Fall on same level from slipping, tripping and stumbling without subsequent striking against object, initial encounter: Secondary | ICD-10-CM | POA: Insufficient documentation

## 2012-09-09 DIAGNOSIS — Z8719 Personal history of other diseases of the digestive system: Secondary | ICD-10-CM | POA: Insufficient documentation

## 2012-09-09 DIAGNOSIS — Y939 Activity, unspecified: Secondary | ICD-10-CM | POA: Insufficient documentation

## 2012-09-09 DIAGNOSIS — F319 Bipolar disorder, unspecified: Secondary | ICD-10-CM | POA: Insufficient documentation

## 2012-09-09 DIAGNOSIS — S79919A Unspecified injury of unspecified hip, initial encounter: Secondary | ICD-10-CM | POA: Insufficient documentation

## 2012-09-09 DIAGNOSIS — F329 Major depressive disorder, single episode, unspecified: Secondary | ICD-10-CM | POA: Insufficient documentation

## 2012-09-09 MED ORDER — NAPROXEN 500 MG PO TABS: 500.0000 mg | ORAL_TABLET | Freq: Two times a day (BID) | ORAL | Status: DC

## 2012-09-09 NOTE — ED Provider Notes (Signed)
History    This chart was scribed for [name], a non-physician practitioner working with Toy Baker, MD by Lewanda Rife, ED Scribe. This patient was seen in room WTR5/WTR5 and the patient's care was started at 1605.     CSN: [id number]  Arrival date & time 09/09/12  1512   First MD Initiated Contact with Patient 09/09/12 1551      Chief Complaint  Patient presents with  . Fall  . Foot Pain  . Hip Pain    (Consider location/radiation/quality/duration/timing/severity/associated sxs/prior treatment) HPI [name] is a 52 y.o. female who presents to the Emergency Department complaining of fall onset yesterday. Pt reports constant moderate pain of left foot since she slipped on ice yesterday. Pt denies loss of consciousness, head trauma and any other injuries. Pt denies taking any medications or icing foot at home to treat symptoms. Pt denies being on blood thinners.   Past Medical History  Diagnosis Date  . Gastro - esophageal reflux disease   . Schizoaffective disorder   . Bipolar mood disorder   . Bipolar affective     complient with meds  . Depression     Past Surgical History  Procedure Laterality Date  . Tubal ligation      Family History  Problem Relation Age of Onset  . Diabetes Mother   . Heart failure Father     History  Substance Use Topics  . Smoking status: Current Every Day Smoker -- 0.50 packs/day    Types: Cigarettes  . Smokeless tobacco: Not on file  . Alcohol Use: No    OB History   Grav Para Term Preterm Abortions TAB SAB Ect Mult Living                  Review of Systems  Musculoskeletal: Positive for joint swelling.  All other systems reviewed and are negative.   A complete 10 system review of systems was obtained and all systems are negative except as noted in the HPI and PMH.    Allergies  Benzodiazepines and Penicillins  Home Medications   Current Outpatient Rx  Name  Route  Sig  Dispense  Refill   . busPIRone (BUSPAR) 10 MG tablet   Oral   Take 1 tablet (10 mg total) by mouth 3 (three) times daily. For anxiety without causing disinhibition or depression   90 tablet   0   . carbamazepine (TEGRETOL XR) 200 MG 12 hr tablet   Oral   Take 3 tablets (600 mg total) by mouth at bedtime. For mood control.   90 tablet   0   . citalopram (CELEXA) 40 MG tablet   Oral   Take 1 tablet (40 mg total) by mouth daily. For depression.   30 tablet   0   . mirtazapine (REMERON) 7.5 MG tablet   Oral   Take 7.5 mg by mouth at bedtime.         . risperiDONE (RISPERDAL) 3 MG tablet   Oral   Take 1 tablet (3 mg total) by mouth 2 (two) times daily. For mood control.   60 tablet   0     BP 105/87  Pulse 112  Temp(Src) 99.3 F (37.4 C) (Oral)  Resp 24  SpO2 94%  Physical Exam  Nursing note and vitals reviewed. Constitutional: She is oriented to person, place, and time. She appears well-developed and well-nourished. No distress.  HENT:  Head: Normocephalic and atraumatic.  Eyes: EOM are normal.  Neck: Neck supple. No tracheal deviation present.  Cardiovascular: Normal rate and normal heart sounds.   Pulmonary/Chest: Effort normal. No respiratory distress. She has no wheezes. She has no rales.  Musculoskeletal: Normal range of motion.       Left ankle: Normal. She exhibits normal range of motion, no swelling and no ecchymosis. No tenderness. No lateral malleolus and no medial malleolus tenderness found. Achilles tendon normal. Achilles tendon exhibits no pain.  Mild swelling and ecchymosis over 2, 3, 4 MTP joints of left foot. Mild tenderness to palpation toes 2, 3, 4 of left foot. Full ROM of left ankle.  Neurological: She is alert and oriented to person, place, and time.  Skin: Skin is warm and dry.  Psychiatric: She has a normal mood and affect. Her behavior is normal.    ED Course  Procedures (including critical care time) Medications - No data to display  Dg Foot Complete  Left  09/09/2012  *RADIOLOGY REPORT*  Clinical Data: Pain post trauma  LEFT FOOT - COMPLETE 3+ VIEW  Comparison: None.  Findings: Frontal, oblique, and lateral views were obtained.  There is a fracture of the distal fourth metatarsal with slight impaction and lateral angulation of the distal fracture fragment.  No other fractures are identified.  No dislocation.  There is bony overgrowth of the distal first metatarsal with mild hallux valgus deformity of the first MTP joint.  Other joint spaces appear intact.  There is a spur arising from the inferior calcaneus.  IMPRESSION: Fracture distal fourth metatarsal.   Original Report Authenticated By: Bretta Bang, M.D.       1. Fracture of 4th metatarsal       MDM  Left 4th metatarsal fracture as described above. Splinted in ED. Crutches. Home with follow up with orthopedics.     I personally performed the services described in this documentation, which was scribed in my presence. The recorded information has been reviewed and is accurate.    Lottie Mussel, PA 09/09/12 1727

## 2012-09-09 NOTE — ED Notes (Signed)
Per EMS- patient is a resident of [facility]. Patient slipped on the ice and landed on her right foot an right hip. Patient denies LOC or hitting her head.

## 2012-09-09 NOTE — ED Notes (Signed)
Waiting for [facility] to pick up.

## 2012-09-10 NOTE — ED Provider Notes (Signed)
Medical screening examination/treatment/procedure(s) were performed by non-physician practitioner and as supervising physician I was immediately available for consultation/collaboration.  Toy Baker, MD 09/10/12 2351

## 2013-07-23 ENCOUNTER — Emergency Department (HOSPITAL_COMMUNITY): Payer: Medicare Other

## 2013-07-23 ENCOUNTER — Encounter (HOSPITAL_COMMUNITY): Payer: Self-pay | Admitting: Emergency Medicine

## 2013-07-23 ENCOUNTER — Emergency Department (HOSPITAL_COMMUNITY)
Admission: EM | Admit: 2013-07-23 | Discharge: 2013-07-23 | Disposition: A | Discharge status: Home / Self Care | Payer: Medicare Other | Attending: Emergency Medicine | Admitting: Emergency Medicine

## 2013-07-23 DIAGNOSIS — F319 Bipolar disorder, unspecified: Secondary | ICD-10-CM | POA: Insufficient documentation

## 2013-07-23 DIAGNOSIS — Y929 Unspecified place or not applicable: Secondary | ICD-10-CM | POA: Insufficient documentation

## 2013-07-23 DIAGNOSIS — Z880 Allergy status to penicillin: Secondary | ICD-10-CM | POA: Insufficient documentation

## 2013-07-23 DIAGNOSIS — Z8719 Personal history of other diseases of the digestive system: Secondary | ICD-10-CM | POA: Insufficient documentation

## 2013-07-23 DIAGNOSIS — S99929A Unspecified injury of unspecified foot, initial encounter: Principal | ICD-10-CM

## 2013-07-23 DIAGNOSIS — Z791 Long term (current) use of non-steroidal anti-inflammatories (NSAID): Secondary | ICD-10-CM | POA: Insufficient documentation

## 2013-07-23 DIAGNOSIS — Y9301 Activity, walking, marching and hiking: Secondary | ICD-10-CM | POA: Insufficient documentation

## 2013-07-23 DIAGNOSIS — S8990XA Unspecified injury of unspecified lower leg, initial encounter: Secondary | ICD-10-CM | POA: Insufficient documentation

## 2013-07-23 DIAGNOSIS — M25571 Pain in right ankle and joints of right foot: Secondary | ICD-10-CM

## 2013-07-23 DIAGNOSIS — F259 Schizoaffective disorder, unspecified: Secondary | ICD-10-CM | POA: Insufficient documentation

## 2013-07-23 DIAGNOSIS — S99919A Unspecified injury of unspecified ankle, initial encounter: Principal | ICD-10-CM

## 2013-07-23 DIAGNOSIS — W1809XA Striking against other object with subsequent fall, initial encounter: Secondary | ICD-10-CM | POA: Insufficient documentation

## 2013-07-23 DIAGNOSIS — Z79899 Other long term (current) drug therapy: Secondary | ICD-10-CM | POA: Insufficient documentation

## 2013-07-23 DIAGNOSIS — F17200 Nicotine dependence, unspecified, uncomplicated: Secondary | ICD-10-CM | POA: Insufficient documentation

## 2013-07-23 MED ORDER — IBUPROFEN 600 MG PO TABS: 600.0000 mg | ORAL_TABLET | Freq: Four times a day (QID) | ORAL | Status: DC | PRN

## 2013-07-23 MED ORDER — IBUPROFEN 800 MG PO TABS
800.0000 mg | ORAL_TABLET | Freq: Once | ORAL | Status: AC
  Administered 2013-07-23: 800 mg via ORAL
  Filled 2013-07-23: qty 1

## 2013-07-23 NOTE — Discharge Instructions (Signed)
Read the information below.  Use the prescribed medication as directed.  Please discuss all new medications with your pharmacist.  You may return to the Emergency Department at any time for worsening condition or any new symptoms that concern you.  If you develop uncontrolled pain, weakness or numbness of the extremity, severe discoloration of the skin, or you are unable to walk, return to the ER for a recheck.    ° ° °Ankle Pain °Ankle pain is a common symptom. The bones, cartilage, tendons, and muscles of the ankle joint perform a lot of work each day. The ankle joint holds your body weight and allows you to move around. Ankle pain can occur on either side or back of 1 or both ankles. Ankle pain may be sharp and burning or dull and aching. There may be tenderness, stiffness, redness, or warmth around the ankle. The pain occurs more often when a person walks or puts pressure on the ankle. °CAUSES  °There are many reasons ankle pain can develop. It is important to work with your caregiver to identify the cause since many conditions can impact the bones, cartilage, muscles, and tendons. Causes for ankle pain include: °· Injury, including a break (fracture), sprain, or strain often due to a fall, sports, or a high-impact activity. °· Swelling (inflammation) of a tendon (tendonitis). °· Achilles tendon rupture. °· Ankle instability after repeated sprains and strains. °· Poor foot alignment. °· Pressure on a nerve (tarsal tunnel syndrome). °· Arthritis in the ankle or the lining of the ankle. °· Crystal formation in the ankle (gout or pseudogout). °DIAGNOSIS  °A diagnosis is based on your medical history, your symptoms, results of your physical exam, and results of diagnostic tests. Diagnostic tests may include X-ray exams or a computerized magnetic scan (magnetic resonance imaging, MRI). °TREATMENT  °Treatment will depend on the cause of your ankle pain and may include: °· Keeping pressure off the ankle and limiting  activities. °· Using crutches or other walking support (a cane or brace). °· Using rest, ice, compression, and elevation. °· Participating in physical therapy or home exercises. °· Wearing shoe inserts or special shoes. °· Losing weight. °· Taking medications to reduce pain or swelling or receiving an injection. °· Undergoing surgery. °HOME CARE INSTRUCTIONS  °· Only take over-the-counter or prescription medicines for pain, discomfort, or fever as directed by your caregiver. °· Put ice on the injured area. °· Put ice in a plastic bag. °· Place a towel between your skin and the bag. °· Leave the ice on for 15-20 minutes at a time, 03-04 times a day. °· Keep your leg raised (elevated) when possible to lessen swelling. °· Avoid activities that cause ankle pain. °· Follow specific exercises as directed by your caregiver. °· Record how often you have ankle pain, the location of the pain, and what it feels like. This information may be helpful to you and your caregiver. °· Ask your caregiver about returning to work or sports and whether you should drive. °· Follow up with your caregiver for further examination, therapy, or testing as directed. °SEEK MEDICAL CARE IF:  °· Pain or swelling continues or worsens beyond 1 week. °· You have an oral temperature above 102° F (38.9° C). °· You are feeling unwell or have chills. °· You are having an increasingly difficult time with walking. °· You have loss of sensation or other new symptoms. °· You have questions or concerns. °MAKE SURE YOU:  °· Understand these instructions. °· Will   watch your condition. °· Will get help right away if you are not doing well or get worse. °Document Released: 12/27/2009 Document Revised: 10/01/2011 Document Reviewed: 12/27/2009 °[employer]® Patient Information ©2014 [employer]. ° °

## 2013-07-23 NOTE — ED Provider Notes (Signed)
CSN: [id number]     Arrival date & time 07/23/13  1640 History   First MD Initiated Contact with Patient 07/23/13 1713 This chart was scribed for non-physician practitioner Trixie DredgeEmily Travian Kerner, PA-C working with Flint MelterElliott L Wentz, MD by Valera CastleSteven Perry, ED scribe. This patient was seen in room WTR7/WTR7 and the patient's care was started at 5:30 PM.     Chief Complaint  Patient presents with  . Fall  . Foot Pain    The history is provided by the patient. No language interpreter was used.   HPI Comments: [name] is a 53 y.o. female who presents to the Emergency Department complaining of sudden, constant, right foot pain, with a severity of 10/10, onset this morning when she fell on her foot after trying to walk on it and noting her foot was asleep. She denies having taken anything for her pain. She denies numbness, weakness, and any other associated symptoms.   PCP - Kaleen MaskELKINS,WILSON OLIVER, MD  Past Medical History  Diagnosis Date  . Gastro - esophageal reflux disease   . Schizoaffective disorder   . Bipolar mood disorder   . Bipolar affective     complient with meds  . Depression    Past Surgical History  Procedure Laterality Date  . Tubal ligation     Family History  Problem Relation Age of Onset  . Diabetes Mother   . Heart failure Father    History  Substance Use Topics  . Smoking status: Current Every Day Smoker -- 0.50 packs/day    Types: Cigarettes  . Smokeless tobacco: Not on file  . Alcohol Use: No   OB History   Grav Para Term Preterm Abortions TAB SAB Ect Mult Living                 Review of Systems  Musculoskeletal: Positive for arthralgias (right ankle).  Skin: Negative for color change, pallor and wound.  Neurological: Negative for weakness and numbness.    Allergies  Benzodiazepines and Penicillins  Home Medications   Current Outpatient Rx  Name  Route  Sig  Dispense  Refill  . busPIRone (BUSPAR) 10 MG tablet   Oral   Take 1 tablet (10 mg total) by  mouth 3 (three) times daily. For anxiety without causing disinhibition or depression   90 tablet   0   . carbamazepine (TEGRETOL XR) 200 MG 12 hr tablet   Oral   Take 3 tablets (600 mg total) by mouth at bedtime. For mood control.   90 tablet   0   . citalopram (CELEXA) 40 MG tablet   Oral   Take 1 tablet (40 mg total) by mouth daily. For depression.   30 tablet   0   . mirtazapine (REMERON) 7.5 MG tablet   Oral   Take 7.5 mg by mouth at bedtime.         . naproxen (NAPROSYN) 500 MG tablet   Oral   Take 1 tablet (500 mg total) by mouth 2 (two) times daily.   30 tablet   0   . risperiDONE (RISPERDAL) 3 MG tablet   Oral   Take 1 tablet (3 mg total) by mouth 2 (two) times daily. For mood control.   60 tablet   0    BP 107/80  Pulse 70  Temp(Src) 97.9 F (36.6 C) (Oral)  Resp 20  SpO2 92%  Physical Exam  Nursing note and vitals reviewed. Constitutional: She appears well-developed and well-nourished. No distress.  HENT:  Head: Normocephalic and atraumatic.  Neck: Neck supple.  Cardiovascular:  Right foot cap refill < 2 seconds.  Pulmonary/Chest: Effort normal.  Musculoskeletal:  Swelling over lateral malleolus. Ttp. No bony tenderness of foot. No calf swelling, tenderness. Sensation intact.   Neurological: She is alert.  Skin: She is not diaphoretic.    ED Course  Procedures (including critical care time)  DIAGNOSTIC STUDIES: Oxygen Saturation is 92% on room air, adequate by my interpretation.    COORDINATION OF CARE: 5:33 PM-Discussed treatment plan which includes right foot xray with pt at bedside and pt agreed to plan.   Labs Review Labs Reviewed - No data to display Imaging Review Dg Ankle Complete Right  07/23/2013   CLINICAL DATA:  Ankle pain following injury  EXAM: RIGHT ANKLE - COMPLETE 3+ VIEW  COMPARISON:  None.  FINDINGS: Ankle mortise intact. The talar dome is normal. No malleolar fracture. The calcaneus is normal.  IMPRESSION: No fracture  or dislocation the right ankle   Electronically Signed   By: Genevive Bi M.D.   On: 07/23/2013 17:35    EKG Interpretation   None       MDM   1. Right ankle pain    Patient with inversion injury to right ankle after trying to walk in her foot went to sleep while sitting today. She has no weakness or numbness. Neurovascularly intact. X-ray is negative. Patient placed in a so. Patient has been walking without difficulty in the department. Discharged with ibuprofen.  Discussed result, findings, treatment, and follow up  with patient.  Pt given return precautions.  Pt verbalizes understanding and agrees with plan.       I personally performed the services described in this documentation, which was scribed in my presence. The recorded information has been reviewed and is accurate.    Trixie Dredge, PA-C 07/23/13 [phone]

## 2013-07-23 NOTE — ED Notes (Signed)
Pt states this am her foot fell asleep and she hit the side of her foot and has pain to foot, no swelling noted.

## 2013-07-23 NOTE — ED Provider Notes (Signed)
Medical screening examination/treatment/procedure(s) were performed by non-physician practitioner and as supervising physician I was immediately available for consultation/collaboration.  Flint MelterElliott L Javiel Canepa, MD 07/23/13 [phone]

## 2013-07-23 NOTE — ED Notes (Signed)
Bed: [facility] Expected date:  Expected time:  Means of arrival:  Comments: EMS ankle pain (ambulatory)

## 2014-05-03 ENCOUNTER — Encounter (HOSPITAL_COMMUNITY): Payer: Self-pay | Admitting: Emergency Medicine

## 2014-05-03 ENCOUNTER — Emergency Department (HOSPITAL_COMMUNITY)
Admission: EM | Admit: 2014-05-03 | Discharge: 2014-05-04 | Disposition: A | Discharge status: Other Acute Inpatient Hospital | Payer: Medicare Other | Source: Home / Self Care | Attending: Emergency Medicine | Admitting: Emergency Medicine

## 2014-05-03 DIAGNOSIS — Z880 Allergy status to penicillin: Secondary | ICD-10-CM | POA: Insufficient documentation

## 2014-05-03 DIAGNOSIS — Z8719 Personal history of other diseases of the digestive system: Secondary | ICD-10-CM | POA: Insufficient documentation

## 2014-05-03 DIAGNOSIS — Z79899 Other long term (current) drug therapy: Secondary | ICD-10-CM | POA: Insufficient documentation

## 2014-05-03 DIAGNOSIS — Z720 Tobacco use: Secondary | ICD-10-CM | POA: Insufficient documentation

## 2014-05-03 DIAGNOSIS — R45851 Suicidal ideations: Secondary | ICD-10-CM

## 2014-05-03 DIAGNOSIS — F251 Schizoaffective disorder, depressive type: Secondary | ICD-10-CM | POA: Diagnosis not present

## 2014-05-03 LAB — COMPREHENSIVE METABOLIC PANEL
ALBUMIN: 3.5 g/dL (ref 3.5–5.2)
ALK PHOS: 89 U/L (ref 39–117)
ALT: 12 U/L (ref 0–35)
ANION GAP: 12 (ref 5–15)
AST: 17 U/L (ref 0–37)
BUN: 12 mg/dL (ref 6–23)
CHLORIDE: 95 meq/L — AB (ref 96–112)
CO2: 28 mEq/L (ref 19–32)
CREATININE: 0.79 mg/dL (ref 0.50–1.10)
Calcium: 9 mg/dL (ref 8.4–10.5)
GFR calc non Af Amer: >90 mL/min (ref >90)
GLUCOSE: 125 mg/dL — AB (ref 70–99)
POTASSIUM: 3.9 meq/L (ref 3.7–5.3)
Sodium: 135 mEq/L — ABNORMAL LOW (ref 137–147)
TOTAL PROTEIN: 7.5 g/dL (ref 6.0–8.3)

## 2014-05-03 LAB — CBC WITH DIFFERENTIAL/PLATELET
BASOS PCT: 0 % (ref 0–1)
Basophils Absolute: 0 10*3/uL (ref 0.0–0.1)
EOS ABS: 0.1 10*3/uL (ref 0.0–0.7)
Eosinophils Relative: 1 % (ref 0–5)
HEMATOCRIT: 46.7 % — AB (ref 36.0–46.0)
HEMOGLOBIN: 16.8 g/dL — AB (ref 12.0–15.0)
LYMPHS ABS: 2.3 10*3/uL (ref 0.7–4.0)
Lymphocytes Relative: 29 % (ref 12–46)
MCH: 33.1 pg (ref 26.0–34.0)
MCHC: 36 g/dL (ref 30.0–36.0)
MCV: 92.1 fL (ref 78.0–100.0)
MONO ABS: 1 10*3/uL (ref 0.1–1.0)
MONOS PCT: 12 % (ref 3–12)
Neutro Abs: 4.7 10*3/uL (ref 1.7–7.7)
Neutrophils Relative %: 58 % (ref 43–77)
Platelets: 263 10*3/uL (ref 150–400)
RBC: 5.07 MIL/uL (ref 3.87–5.11)
RDW: 13.5 % (ref 11.5–15.5)
WBC: 8 10*3/uL (ref 4.0–10.5)

## 2014-05-03 LAB — ETHANOL

## 2014-05-03 MED ORDER — ACETAMINOPHEN 325 MG PO TABS: 650.0000 mg | ORAL_TABLET | ORAL | Status: DC | PRN

## 2014-05-03 MED ORDER — ZOLPIDEM TARTRATE 5 MG PO TABS
5.0000 mg | ORAL_TABLET | Freq: Every evening | ORAL | Status: DC | PRN
Start: 2014-05-03 — End: 2014-05-04

## 2014-05-03 MED ORDER — ALUM & MAG HYDROXIDE-SIMETH 200-200-20 MG/5ML PO SUSP: 30.0000 mL | ORAL | Status: DC | PRN

## 2014-05-03 MED ORDER — IBUPROFEN 400 MG PO TABS: 600.0000 mg | ORAL_TABLET | Freq: Three times a day (TID) | ORAL | Status: DC | PRN

## 2014-05-03 MED ORDER — NICOTINE 21 MG/24HR TD PT24
21.0000 mg | MEDICATED_PATCH | Freq: Every day | TRANSDERMAL | Status: DC
  Administered 2014-05-04: 21 mg via TRANSDERMAL
  Filled 2014-05-03: qty 1

## 2014-05-03 MED ORDER — ONDANSETRON HCL 4 MG PO TABS: 4.0000 mg | ORAL_TABLET | Freq: Three times a day (TID) | ORAL | Status: DC | PRN

## 2014-05-03 NOTE — ED Provider Notes (Signed)
CSN: [id number]     Arrival date & time 05/03/14  2110 History   First MD Initiated Contact with Patient 05/03/14 2330     Chief Complaint  Patient presents with  . Suicidal     (Consider location/radiation/quality/duration/timing/severity/associated sxs/prior Treatment) The history is provided by the patient and medical records.   This is a 53 year old female with past medical history significant for bipolar disorder, depression, schizoaffective disorder, GERD, presenting to the ED for suicidal ideation. States she's been more depressed recently because her 273 year old son is ignoring her. States they have gotten into several arguments, now he refuses to her or speak with her.  States her son is currently living with her ex-husband. She states she has a plan to slit her wrist, she does note that she has several knives at home.  Prior suicide attempt several years ago. She denies any homicidal ideation. Denies any auditory or visual hallucinations. Denies any recent alcohol or illicit drug use.  Denies ingestion of any toxic substances.  VS stable on arrival.  Past Medical History  Diagnosis Date  . Gastro - esophageal reflux disease   . Schizoaffective disorder   . Bipolar mood disorder   . Bipolar affective     complient with meds  . Depression    Past Surgical History  Procedure Laterality Date  . Tubal ligation     Family History  Problem Relation Age of Onset  . Diabetes Mother   . Heart failure Father    History  Substance Use Topics  . Smoking status: Current Every Day Smoker -- 0.50 packs/day    Types: Cigarettes  . Smokeless tobacco: Not on file  . Alcohol Use: No   OB History   Grav Para Term Preterm Abortions TAB SAB Ect Mult Living                 Review of Systems  Psychiatric/Behavioral: Positive for suicidal ideas.  All other systems reviewed and are negative.     Allergies  Benzodiazepines and Penicillins  Home Medications   Prior to Admission  medications   Medication Sig Start Date End Date Taking? Authorizing Provider  busPIRone (BUSPAR) 10 MG tablet Take 1 tablet (10 mg total) by mouth 3 (three) times daily. For anxiety without causing disinhibition or depression 04/30/12  Yes Mike CrazeEdwin O Walker, MD  carbamazepine (TEGRETOL XR) 200 MG 12 hr tablet Take 3 tablets (600 mg total) by mouth at bedtime. For mood control. 04/30/12  Yes Mike CrazeEdwin O Walker, MD  citalopram (CELEXA) 40 MG tablet Take 1 tablet (40 mg total) by mouth daily. For depression. 04/30/12  Yes Mike CrazeEdwin O Walker, MD  mirtazapine (REMERON) 15 MG tablet Take 7.5 mg by mouth at bedtime.   Yes Historical Provider, MD  risperiDONE (RISPERDAL) 3 MG tablet Take 1 tablet (3 mg total) by mouth 2 (two) times daily. For mood control. 04/30/12  Yes Mike CrazeEdwin O Walker, MD   BP 112/71  Pulse 84  Temp(Src) 98.2 F (36.8 C)  Resp 20  Wt 182 lb (82.555 kg)  SpO2 97%  Physical Exam  Nursing note and vitals reviewed. Constitutional: She is oriented to person, place, and time. She appears well-developed and well-nourished. No distress.  HENT:  Head: Normocephalic and atraumatic.  Mouth/Throat: Oropharynx is clear and moist.  Poor dentition  Eyes: Conjunctivae and EOM are normal. Pupils are equal, round, and reactive to light.  Neck: Normal range of motion. Neck supple.  Cardiovascular: Normal rate, regular rhythm and  normal heart sounds.   Pulmonary/Chest: Effort normal and breath sounds normal. No respiratory distress. She has no wheezes.  Abdominal: Soft. Bowel sounds are normal. There is no tenderness. There is no guarding.  Musculoskeletal: Normal range of motion.  Neurological: She is alert and oriented to person, place, and time.  Skin: Skin is warm and dry. She is not diaphoretic.  Psychiatric: She has a normal mood and affect. She is not withdrawn and not actively hallucinating. She expresses suicidal ideation. She expresses no homicidal ideation. She expresses suicidal plans. She  expresses no homicidal plans.  Endorses active SI with plan; denies HI/AVH    ED Course  Procedures (including critical care time) Labs Review Labs Reviewed  CBC WITH DIFFERENTIAL - Abnormal; Notable for the following:    Hemoglobin 16.8 (*)    HCT 46.7 (*)    All other components within normal limits  COMPREHENSIVE METABOLIC PANEL - Abnormal; Notable for the following:    Sodium 135 (*)    Chloride 95 (*)    Glucose, Bld 125 (*)    Total Bilirubin <0.2 (*)    All other components within normal limits  ETHANOL  URINE RAPID DRUG SCREEN (HOSP PERFORMED)    Imaging Review No results found.   EKG Interpretation None      MDM   Final diagnoses:  Suicidal ideation   53 year old female with suicidal ideation with plan to slit her wrists. She does note she has multiple knives at home in which she may use to carry out this plan.  Patient has no physical complaints, exam unremarkable. Lab work was obtained which is reassuring. Patient medically cleared and waiting TTS evaluation.  Temp holding orders and home meds in place.  Garlon HatchetLisa M Taraji Mungo, PA-C 05/04/14 [phone]

## 2014-05-03 NOTE — ED Notes (Signed)
Pt. reports suicidal ideation onset today - plans to cut her wrist , denies hallucinations .

## 2014-05-03 NOTE — ED Notes (Signed)
STAFFING OFFICE AND CHARGE NURSE NOTIFIED FOR PT.'S SITTER , SECURITY NOTIFIED TO WAND PT. PT. WEARING PAPER SCRUBS.

## 2014-05-04 ENCOUNTER — Encounter (HOSPITAL_COMMUNITY): Payer: Self-pay | Admitting: *Deleted

## 2014-05-04 ENCOUNTER — Inpatient Hospital Stay (HOSPITAL_COMMUNITY)
Admission: AD | Admit: 2014-05-04 | Discharge: 2014-05-14 | DRG: 885 | Disposition: A | Discharge status: Home / Self Care | Payer: Medicare Other | Source: Intra-hospital | Attending: Psychiatry | Admitting: Psychiatry

## 2014-05-04 DIAGNOSIS — Z8249 Family history of ischemic heart disease and other diseases of the circulatory system: Secondary | ICD-10-CM | POA: Diagnosis not present

## 2014-05-04 DIAGNOSIS — R45851 Suicidal ideations: Secondary | ICD-10-CM | POA: Diagnosis present

## 2014-05-04 DIAGNOSIS — F251 Schizoaffective disorder, depressive type: Secondary | ICD-10-CM | POA: Diagnosis present

## 2014-05-04 DIAGNOSIS — E785 Hyperlipidemia, unspecified: Secondary | ICD-10-CM | POA: Diagnosis present

## 2014-05-04 DIAGNOSIS — Z833 Family history of diabetes mellitus: Secondary | ICD-10-CM

## 2014-05-04 DIAGNOSIS — F419 Anxiety disorder, unspecified: Secondary | ICD-10-CM | POA: Diagnosis not present

## 2014-05-04 DIAGNOSIS — Z599 Problem related to housing and economic circumstances, unspecified: Secondary | ICD-10-CM

## 2014-05-04 DIAGNOSIS — F17210 Nicotine dependence, cigarettes, uncomplicated: Secondary | ICD-10-CM | POA: Diagnosis present

## 2014-05-04 DIAGNOSIS — G4700 Insomnia, unspecified: Secondary | ICD-10-CM | POA: Diagnosis not present

## 2014-05-04 DIAGNOSIS — K219 Gastro-esophageal reflux disease without esophagitis: Secondary | ICD-10-CM | POA: Diagnosis not present

## 2014-05-04 HISTORY — DX: Anxiety disorder, unspecified: F41.9

## 2014-05-04 LAB — RAPID URINE DRUG SCREEN, HOSP PERFORMED
Amphetamines: NOT DETECTED
Barbiturates: NOT DETECTED
Benzodiazepines: NOT DETECTED
COCAINE: NOT DETECTED
OPIATES: NOT DETECTED
Tetrahydrocannabinol: NOT DETECTED

## 2014-05-04 MED ORDER — CITALOPRAM HYDROBROMIDE 40 MG PO TABS
40.0000 mg | ORAL_TABLET | Freq: Every day | ORAL | Status: DC
  Administered 2014-05-04 – 2014-05-14 (×11): 40 mg via ORAL
  Filled 2014-05-04 (×12): qty 1

## 2014-05-04 MED ORDER — CITALOPRAM HYDROBROMIDE 10 MG PO TABS
40.0000 mg | ORAL_TABLET | Freq: Every day | ORAL | Status: DC
  Administered 2014-05-04: 40 mg via ORAL
  Filled 2014-05-04: qty 4

## 2014-05-04 MED ORDER — ACETAMINOPHEN 325 MG PO TABS: 650.0000 mg | ORAL_TABLET | Freq: Four times a day (QID) | ORAL | Status: DC | PRN

## 2014-05-04 MED ORDER — MIRTAZAPINE 7.5 MG PO TABS
7.5000 mg | ORAL_TABLET | Freq: Every day | ORAL | Status: DC
  Filled 2014-05-04 (×2): qty 1

## 2014-05-04 MED ORDER — RISPERIDONE 3 MG PO TABS
3.0000 mg | ORAL_TABLET | Freq: Two times a day (BID) | ORAL | Status: DC
  Administered 2014-05-04 – 2014-05-14 (×20): 3 mg via ORAL
  Filled 2014-05-04 (×22): qty 1

## 2014-05-04 MED ORDER — CARBAMAZEPINE ER 400 MG PO TB12: 600.0000 mg | ORAL_TABLET | Freq: Every day | ORAL | Status: DC

## 2014-05-04 MED ORDER — MAGNESIUM HYDROXIDE 400 MG/5ML PO SUSP: 30.0000 mL | Freq: Every day | ORAL | Status: DC | PRN

## 2014-05-04 MED ORDER — BUSPIRONE HCL 10 MG PO TABS
10.0000 mg | ORAL_TABLET | Freq: Three times a day (TID) | ORAL | Status: DC
  Administered 2014-05-04 – 2014-05-05 (×3): 10 mg via ORAL
  Filled 2014-05-04 (×8): qty 1

## 2014-05-04 MED ORDER — ALUM & MAG HYDROXIDE-SIMETH 200-200-20 MG/5ML PO SUSP: 30.0000 mL | ORAL | Status: DC | PRN

## 2014-05-04 MED ORDER — BENZTROPINE MESYLATE 1 MG PO TABS: 1.0000 mg | ORAL_TABLET | Freq: Two times a day (BID) | ORAL | Status: DC | PRN

## 2014-05-04 MED ORDER — CARBAMAZEPINE ER 200 MG PO TB12
200.0000 mg | ORAL_TABLET | Freq: Two times a day (BID) | ORAL | Status: DC
  Administered 2014-05-04 – 2014-05-14 (×20): 200 mg via ORAL
  Filled 2014-05-04 (×24): qty 1

## 2014-05-04 MED ORDER — CARBAMAZEPINE ER 400 MG PO TB12
600.0000 mg | ORAL_TABLET | Freq: Every day | ORAL | Status: DC
  Filled 2014-05-04 (×2): qty 1

## 2014-05-04 MED ORDER — BUSPIRONE HCL 10 MG PO TABS
10.0000 mg | ORAL_TABLET | Freq: Three times a day (TID) | ORAL | Status: DC
  Administered 2014-05-04: 10 mg via ORAL
  Filled 2014-05-04: qty 1

## 2014-05-04 MED ORDER — RISPERIDONE 0.5 MG PO TABS
3.0000 mg | ORAL_TABLET | Freq: Two times a day (BID) | ORAL | Status: DC
  Administered 2014-05-04: 3 mg via ORAL
  Filled 2014-05-04 (×2): qty 6

## 2014-05-04 MED ORDER — INFLUENZA VAC SPLIT QUAD 0.5 ML IM SUSY
0.5000 mL | PREFILLED_SYRINGE | INTRAMUSCULAR | Status: AC
  Administered 2014-05-05: 0.5 mL via INTRAMUSCULAR
  Filled 2014-05-04: qty 0.5

## 2014-05-04 MED ORDER — MIRTAZAPINE 7.5 MG PO TABS
7.5000 mg | ORAL_TABLET | Freq: Every day | ORAL | Status: DC
  Administered 2014-05-04: 7.5 mg via ORAL
  Filled 2014-05-04 (×4): qty 1

## 2014-05-04 NOTE — BH Assessment (Addendum)
Attempted to reach EDP Dr.Kohut @[phone]  and unable to reach him at this time. This Clinical research associatewriter will assess patient as to avoid further delay of patient care.  Spoke with pt's ED nurse Ed who will communicate with nurse Megan that tele-psych cart needs to be set up for patient to be assessed by TTS at 0900.   Glorious PeachNajah Addyson Traub, MS, LCASA Assessment Counselor

## 2014-05-04 NOTE — ED Notes (Signed)
[name] called for transportation

## 2014-05-04 NOTE — BH Assessment (Signed)
TTS assessment completed.  Mavrik Bynum, MS, LCASA Assessment Counselor  

## 2014-05-04 NOTE — Progress Notes (Signed)
[facility], [facility], voluntary admission.  Patient has been very depressed, planned to cut wrists with butcher knife because her 55 yr old son will not talk to her.  History of schizoaffective and bipolar.  ACT Team brought her to hospital and provides medications for her.  Patient is divorced, son lives and works with his dad.  Patient has disability checks.  Lives by herself in her resident and will return to that residence.  Stated she smokes two packs cigarettes daily.  Denied alcohol or drug use.  Takes risperdal, tegretol, remeron and celexa at home.  Rated depression, anxiety and hopeless #10.  Denied SI and HI at this time, contracts for safety.  Denied A/V hallucinations.  Denied physical, sexual, verbal abuse.  Parents deceased.  Graduated high school, one year technical school.  Tattoos on R ankle and lower back.   Patient given food and drink.  Low fall risk. [address] has shoes, key, cigarette lighter, 2 cents.

## 2014-05-04 NOTE — ED Notes (Signed)
Pt taking shower at the time.

## 2014-05-04 NOTE — Tx Team (Signed)
Initial Interdisciplinary Treatment Plan   PATIENT STRESSORS: Financial difficulties Marital or family conflict   PROBLEM LIST: Problem List/Patient Goals Date to be addressed Date deferred Reason deferred Estimated date of resolution  "suicidal ideation" 05/04/2014   D/c        "depression" 05/04/2014   D/C        "anxiety" 05/04/2014   D/c                           DISCHARGE CRITERIA:  Ability to meet basic life and health needs Adequate post-discharge living arrangements Improved stabilization in mood, thinking, and/or behavior Medical problems require only outpatient monitoring Motivation to continue treatment in a less acute level of care Need for constant or close observation no longer present Reduction of life-threatening or endangering symptoms to within safe limits Safe-care adequate arrangements made Verbal commitment to aftercare and medication compliance  PRELIMINARY DISCHARGE PLAN: Attend aftercare/continuing care group Attend PHP/IOP Attend 12-step recovery group Outpatient therapy Participate in family therapy Return to previous living arrangement  PATIENT/FAMIILY INVOLVEMENT: This treatment plan has been presented to and reviewed with the patient, [name], patient seeking assistance with depression and SI thoughts.  The patient and family have been given the opportunity to ask questions and make suggestions.  [name], Merrick Feutz Shephard 05/04/2014, 5:10 PM

## 2014-05-04 NOTE — Progress Notes (Signed)
D: Patient in bed on approach.  Patient appears depressed.  Patient states she is just getting to the hospital today and states she feels a lot better because she feels safe.  Patient states she was originally having suicidal thoughts but now denies since she was admitted.  Patient states she did not have a goal for today since it was her first day.  Patient denies HI/AVH.   A: Staff to monitor Q 15 mins for safety.  Encouragement and support offered.  Scheduled medications administered per orders. R: Patient remains safe on the unit.  Patient attended group tonight.  Patient visible on the unit and interacting with peers.  Patient taking administered medications.

## 2014-05-04 NOTE — Progress Notes (Signed)
Adult Psychoeducational Group Note  Date:  05/04/2014 Time:  9:25 PM  Group Topic/Focus:  Wrap-Up Group:   The focus of this group is to help patients review their daily goal of treatment and discuss progress on daily workbooks.  Participation Level:  Minimal  Participation Quality:  Appropriate  Affect:  Flat  Cognitive:  Oriented  Insight: Limited  Engagement in Group:  Limited  Modes of Intervention:  Socialization and Support  Additional Comments:  Patient attended but had limited participation in group tonight. She reports having a decent day. [facility]. [facility].  Lita MainsFrancis, [place] [facility] 05/04/2014, 9:25 PM
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
## 2014-05-04 NOTE — ED Notes (Signed)
telepsych in progress at the time.

## 2014-05-04 NOTE — BH Assessment (Signed)
[facility] has been assigned to bed 401-1. Spoke with pt's ED nurse whom is in agreement with having patient sign voluntary support paperwork and will arrange transfer via [place].  Dr.Kohut EDP has been [facility] of [facility].   Glorious PeachNajah Valin Massie, MS, LCASA Assessment Counselor

## 2014-05-05 DIAGNOSIS — R45851 Suicidal ideations: Secondary | ICD-10-CM

## 2014-05-05 DIAGNOSIS — F251 Schizoaffective disorder, depressive type: Secondary | ICD-10-CM | POA: Diagnosis not present

## 2014-05-05 LAB — BASIC METABOLIC PANEL
ANION GAP: 13 (ref 5–15)
BUN: 15 mg/dL (ref 6–23)
CHLORIDE: 95 meq/L — AB (ref 96–112)
CO2: 29 mEq/L (ref 19–32)
Calcium: 9.4 mg/dL (ref 8.4–10.5)
Creatinine, Ser: 1.2 mg/dL — ABNORMAL HIGH (ref 0.50–1.10)
GFR calc Af Amer: 58 mL/min — ABNORMAL LOW (ref >90)
GFR calc non Af Amer: 50 mL/min — ABNORMAL LOW (ref >90)
GLUCOSE: 78 mg/dL (ref 70–99)
POTASSIUM: 4.1 meq/L (ref 3.7–5.3)
SODIUM: 137 meq/L (ref 137–147)

## 2014-05-05 MED ORDER — BUSPIRONE HCL 10 MG PO TABS: 10.0000 mg | ORAL_TABLET | Freq: Two times a day (BID) | ORAL | Status: DC

## 2014-05-05 MED ORDER — MIRTAZAPINE 7.5 MG PO TABS
7.5000 mg | ORAL_TABLET | Freq: Every day | ORAL | Status: DC
  Administered 2014-05-05 – 2014-05-13 (×9): 7.5 mg via ORAL
  Filled 2014-05-05 (×10): qty 1

## 2014-05-05 MED ORDER — TUBERCULIN PPD 5 UNIT/0.1ML ID SOLN
5.0000 [IU] | Freq: Once | INTRADERMAL | Status: AC
  Administered 2014-05-05: 5 [IU] via INTRADERMAL

## 2014-05-05 MED ORDER — GABAPENTIN 100 MG PO CAPS
200.0000 mg | ORAL_CAPSULE | Freq: Three times a day (TID) | ORAL | Status: DC
  Administered 2014-05-05 – 2014-05-14 (×27): 200 mg via ORAL
  Filled 2014-05-05 (×31): qty 2

## 2014-05-05 NOTE — Progress Notes (Signed)
Pt has been up and active in the milieu today. She rated all her depression, hopelssness and anxiety a 10 on her self-inventory. She denies any S/H ideation or A/V/H. She denies any physical complaints. Her goal "try and get out of here".  She plans to go to all the groups, talk with her doctor and case manager. Pt had a PPD placed today and will need to be f/u Friday the progress note is on front of her shadow chart.

## 2014-05-05 NOTE — [facility] Group Notes (Signed)
[facility] Note   05/05/2014 10:10 AM  Participation Quality:  Minimal   Mood/Affect:  Flat  Depression Rating:  10  Anxiety Rating:  10  Thoughts of Suicide:  No Will you contract for safety?   NA  Current AVH:  No  Plan for Discharge/Comments: Pt arrived voluntarily for SI. She does not endorse SI or AVH today. [facility] but cannot return. She is receiving ACT team services from [facility]. CSW is assessing placement, although pt believes that [facility] at [facility].   Transportation Means: ACT Team   Supports: ACT Team   Hyatt,Candace

## 2014-05-05 NOTE — Progress Notes (Signed)
Pt did not attend wrap up group this evening.  

## 2014-05-05 NOTE — [facility] Group Notes (Signed)
[facility] LCSW Group Therapy  05/05/2014 1:10 PM  Type of Therapy:  Group Therapy  Participation Level: None  Participation Quality:  Attentive  Affect:  Depressed  Cognitive:  Appropriate  Insight:  Limited  Engagement in Therapy:  Limited  Modes of Intervention:  Discussion, Education, Socialization and Support  Summary of Progress/Problems: Mental Health Association (MHA) speaker came to talk about his personal journey with substance abuse and mental illness. Group members were challenged to process ways by which to relate to the speaker. MHA speaker provided handouts and educational information pertaining to groups and services offered by the [facility].   Hyatt,Candace 05/05/2014, 1:10 PM

## 2014-05-05 NOTE — [facility] Counselor (Signed)
Adult Comprehensive Assessment  Patient ID: [name], female DOB: 12-15-1960, 53 y.o. MRN: [id number]  Information Source:  Information source: Patient  Current Stressors:  Educational / Learning stressors: None reported by pt  Employment / Job issues: Disability Family Relationships: Pt reported that she would like to have a better relationship with her son, and is stressed because she has not heard from him for some time Financial / Lack of resources (include bankruptcy): Pts only financial resource is disability  Housing / Lack of housing:[facility] since she has she is being evicted from supervised living at [facility]  Living/Environment/Situation:  Living Arrangements: [facility] living, but is being evicted "for not being clean enough"  Living conditions (as described by patient or guardian): Pt reported that she doen't really like it referring to it as "dull"  How long has patient lived in current situation?: Less than a year  What is atmosphere in current home: Comfortable  Family History:  Marital status: Divorced  Divorced, when?: 2007  What types of issues is patient dealing with in the relationship?: Pt reported she only speaks to ex-husband occasionally  Additional relationship information: NA  Does patient have children?: Yes  How many children?: 1  How is patient's relationship with their children?: Pt reported that son is angry with her but wants to change that  Childhood History:  By whom was/is the patient raised?: Both parents  Additional childhood history information: Pt reported childhood was good  Description of patient's relationship with caregiver when they were a child: good  Patient's description of current relationship with people who raised him/her: Deceased  Does patient have siblings?: Yes  Number of Siblings: 3  Description of patient's current relationship with siblings: Pt reported  relationship is not good  Did patient suffer any verbal/emotional/physical/sexual abuse as a child?: No  Did patient suffer from severe childhood neglect?: No  Has patient ever been sexually abused/assaulted/raped as an adolescent or adult?: No  Was the patient ever a victim of a crime or a disaster?: No  Witnessed domestic violence?: No  Has patient been effected by domestic violence as an adult?: No  Education:  Highest grade of school patient has completed: 1 year of college  Currently a student?: No  Name of school: NA  Learning disability?: No  Employment/Work Situation:  Employment situation: On disability  Why is patient on disability: Bipolar  How long has patient been on disability: Since 2001  Patient's job has been impacted by current illness: Yes  Describe how patient's job has been implacted: Unable to focus  What is the longest time patient has a held a job?: 12 years  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  Has patient ever been in the [employer] and Companymilitary?: No  Has patient ever served in Buyer, retailcombat?: No  Financial Resources:  Financial resources: Insurance claims handlereceives SSDI  Does patient have a Lawyerrepresentative payee or guardian?: Yes  Name of representative payee or guardian: [facility]  Alcohol/Substance Abuse:  What has been your use of drugs/alcohol within the last 12 months?: Pt denies  If attempted suicide, did drugs/alcohol play a role in this?: No  Alcohol/Substance Abuse Treatment Hx: Denies past history  If yes, describe treatment: NA  Has alcohol/substance abuse ever caused legal problems?: No  Social Support System:  Conservation officer, natureatient's Community Support System: Poor  Describe Community Support System:  ACT team and one friend Type of faith/religion: Pt reported none  How does patient's faith help to cope with current illness?: NA  Leisure/Recreation:  Leisure and Hobbies: Pt reported she used to like to cook and raise plants  Strengths/Needs:  What things  does the patient do well?: Pt reported gardening and cooking  In what areas does patient struggle / problems for patient: Controling depressive episodes  Discharge Plan: [facility] [[facility] has initiated this discussion with her]  Summary/Recommendations:  Summary and Recommendations : [name] is a 53 YO Caucasian female with a severe and persistent mental illness who decompensated over the last 6 months when she was not getting the external structure she needed to maintain. She is open to a referral to a GH and continueing with the ACT team.Recommendations for treatment include crisis stabilization, case management, medication management, psycho-education to teach coping skills, and group therapy.    Marland Kitchen...

## 2014-05-05 NOTE — Progress Notes (Signed)
Patient ID: [name], female   DOB: 04-25-1961, 53 y.o.   MRN: [id number] PER STATE REGULATIONS 482.30  THIS CHART WAS REVIEWED FOR MEDICAL NECESSITY WITH RESPECT TO THE PATIENT'S ADMISSION/ DURATION OF STAY.  NEXT REVIEW DATE: 05/08/2014  [name] Rebecca Slager, RN, BSN CASE MANAGER

## 2014-05-05 NOTE — [facility] Suicide Risk Assessment (Signed)
   Nursing information obtained from:  Patient Demographic factors:  Divorced or widowed;Caucasian;Low socioeconomic status Current Mental Status:    Loss Factors:  Financial problems / change in socioeconomic status Historical Factors:    Risk Reduction Factors:  Positive therapeutic relationship Total Time spent with patient: 45 minutes  CLINICAL FACTORS:   Previous Psychiatric Diagnoses and Treatments Medical Diagnoses and Treatments/Surgeries  Psychiatric Specialty Exam: Physical Exam  Constitutional: She is oriented to person, place, and time. She appears well-developed and well-nourished.  HENT:  Head: Normocephalic and atraumatic.  Neck: Normal range of motion. Neck supple.  Cardiovascular: Normal rate and regular rhythm.   Respiratory: Effort normal and breath sounds normal.  GI: Soft.  Musculoskeletal: Normal range of motion.  Neurological: She is alert and oriented to person, place, and time.  Psychiatric: Her speech is normal. Her mood appears anxious. Her affect is labile. She is slowed. Cognition and memory are normal. She expresses impulsivity. She exhibits a depressed mood. She expresses suicidal ideation.    Review of Systems  Constitutional: Negative.   HENT: Negative.   Eyes: Negative.   Respiratory: Negative.   Cardiovascular: Negative.   Gastrointestinal: Negative.   Genitourinary: Negative.   Musculoskeletal: Negative.   Skin: Negative.   Neurological: Negative.   Psychiatric/Behavioral: Positive for depression and suicidal ideas. The patient has insomnia.     Blood pressure 107/80, pulse 70, temperature 97.8 F (36.6 C), temperature source Oral, resp. rate 20, height 5' 1.75" (1.568 m), weight 81.194 kg (179 lb), last menstrual period 07/23/2004.Body mass index is 33.02 kg/(m^2).  General Appearance: Disheveled  Eye SolicitorContact::  Fair  Speech:  Normal Rate  Volume:  Normal  Mood:  Anxious and Depressed  Affect:  Appropriate  Thought Process:   Disorganized  Orientation:  Full (Time, Place, and Person)  Thought Content:  WDL  Suicidal Thoughts:  Yes.  without intent/plan  Homicidal Thoughts:  No  Memory:  Immediate;   Fair Recent;   Fair Remote;   Fair  Judgement:  Impaired  Insight:  Lacking  Psychomotor Activity:  Decreased  Concentration:  Fair  Recall:  FiservFair  Fund of Knowledge:Fair  Language: Fair  Akathisia:  No    AIMS (if indicated):     Assets:  Communication Skills Desire for Improvement  Sleep:  Number of Hours: 6.75   Musculoskeletal: Strength & Muscle Tone: within normal limits Gait & Station: normal Patient leans: N/A  COGNITIVE FEATURES THAT CONTRIBUTE TO RISK:  Closed-mindedness Polarized thinking Thought constriction (tunnel vision)    SUICIDE RISK:   Moderate:  Frequent suicidal ideation with limited intensity, and duration, some specificity in terms of plans, no associated intent, good self-control, limited dysphoria/symptomatology, some risk factors present, and identifiable protective factors, including available and accessible social support.  PLAN OF CARE:Please see H&P.   I certify that inpatient services furnished can reasonably be expected to improve the patient's condition.  Rebecca Chavira MD 05/05/2014, 1:32 PM
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
## 2014-05-05 NOTE — H&P (Signed)
Patient was seen face to face for psychiatric evaluation, suicide risk assessment and case discussed with treatment team and NP and made appropriate disposition plans. Reviewed the information documented and agree with the treatment plan.   Lamonta Cypress ,MD Attending Psychiatrist  [facility]    
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
## 2014-05-06 LAB — LIPID PANEL
Cholesterol: 195 mg/dL (ref 0–200)
HDL: 61 mg/dL (ref >39)
LDL Cholesterol: 116 mg/dL — ABNORMAL HIGH (ref 0–99)
Total CHOL/HDL Ratio: 3.2 RATIO
Triglycerides: 89 mg/dL (ref <150)
VLDL: 18 mg/dL (ref 0–40)

## 2014-05-06 LAB — HEMOGLOBIN A1C
Hgb A1c MFr Bld: 5.8 % — ABNORMAL HIGH (ref <5.7)
Mean Plasma Glucose: 120 mg/dL — ABNORMAL HIGH (ref <117)

## 2014-05-06 LAB — TSH: TSH: 1.99 u[IU]/mL (ref 0.350–4.500)

## 2014-05-06 MED ORDER — SIMVASTATIN 20 MG PO TABS
20.0000 mg | ORAL_TABLET | Freq: Every day | ORAL | Status: DC
Start: 2014-05-06 — End: 2014-05-14
  Administered 2014-05-06 – 2014-05-13 (×8): 20 mg via ORAL
  Filled 2014-05-06 (×9): qty 1

## 2014-05-06 MED ORDER — BENZTROPINE MESYLATE 0.5 MG PO TABS
0.5000 mg | ORAL_TABLET | Freq: Two times a day (BID) | ORAL | Status: DC
  Administered 2014-05-06 – 2014-05-14 (×16): 0.5 mg via ORAL
  Filled 2014-05-06 (×18): qty 1

## 2014-05-06 NOTE — Progress Notes (Addendum)
D: Pt denies SI/HI/AVH. Pt is pleasant and cooperative. Pt stated she started having problems after medications were changed.  A: Pt was offered support and encouragement. Pt was given scheduled medications. Pt was encourage to attend groups. Q 15 minute checks were done for safety.   R: Pt is taking medication. Pt has no complaints at this time .Pt receptive to treatment and safety maintained on unit.

## 2014-05-06 NOTE — Progress Notes (Signed)
D: Patient denies SI/HI/AVH. Patient affect and mood are anxious.  Pt interaction with staff is minimal.  Patient did NOT attend group tonight. Patient visible on the milieu. No distress noted. A: Support and encouragement offered. Scheduled medications given to pt. Q 15 min checks continued for patient safety. R: Patient receptive. Patient remains safe on the unit.

## 2014-05-06 NOTE — Progress Notes (Signed)
Adult Psychoeducational Group Note  Date:  05/06/2014 Time:  10:30 AM  Group Topic/Focus:  Morning Wellness Group  Participation Level:  Minimal  Participation Quality:  Drowsy  Affect:  Flat  Cognitive:  Alert and Oriented  Insight: Lacking  Engagement in Group:  Poor  Modes of Intervention:  Discussion  Additional Comments: Patient only participated in the warm-up exercises.  Harold BarbanByrd, Ronecia E 05/06/2014, 3:46 PM

## 2014-05-06 NOTE — [facility] Group Notes (Signed)
[facility] Group Notes:  (Counselor/Nursing/MHT/Case Management/Adjunct)  05/06/2014 1:15PM  Type of Therapy:  Group Therapy  Participation Level:  Active  Participation Quality:  Appropriate  Affect:  Flat  Cognitive:  Oriented  Insight:  Improving  Engagement in Group:  Limited  Engagement in Therapy:  Limited  Modes of Intervention:  Discussion, Exploration and Socialization  Summary of Progress/Problems: The topic for group was balance in life.  Pt participated in the discussion about when their life was in balance and out of balance and how this feels.  Pt discussed ways to get back in balance and short term goals they can work on to get where they want to be.  "I am balanced.  [facility]  I will be happy when that happens."  Limited insight.  No further contributions to group.   Daryel Geraldorth, Arabelle Bollig B 05/06/2014 1:25 PM

## 2014-05-06 NOTE — Progress Notes (Signed)
D: Patient presents with sad, depressed affect and mood. She's attending groups throughout the day, but not actively participating; visible in the milieu, but no interaction with peers on the hall. Patient forwards little information and appears very guarded as well. Adheres to current medication regimen.  A: Support and encouragement provided to patient. Administered scheduled medications per ordering MD. Monitor Q15 minute checks for safety.  R: Patient receptive. Denies SI/HI and AVH. Patient remains safe on the unit.

## 2014-05-06 NOTE — Progress Notes (Signed)
[facility] MD Progress Note  05/06/2014 11:26 AM [name]  MRN:  [id number] Subjective: Patient states ,' I am doing well". Objective: Patient seen and chart reviewed. Patient found in her room ,appears to be calm. Denies any depression or mood swings. Reports feeling better on her medications. Patient reports sleep as fair and appetite as fair.Patient denies SI/HI/AH/VH. Patient is compliant on medications. Patient denies any side effects. Per discussion with CSW -patient was at an ALF ,from where she was moved to an apartment recently. However patient did not do well at that time ,quit taking care of her ADLs and ended up in the hospital.Patient will need placement at a structured environment.   Diagnosis:   DSM5: Primary Psychiatric Diagnosis: Schizoaffective disorder,depressive type ,multiple episodes ,currently in acute episode     Non Psychiatric Diagnosis: GERD Hyperlipidemia      ADL's:  Intact  Sleep: Fair  Appetite:  Fair   Psychiatric Specialty Exam: Physical Exam  ROS  Blood pressure 90/67, pulse 69, temperature 97.1 F (36.2 C), temperature source Oral, resp. rate 18, height 5' 1.75" (1.568 m), weight 81.194 kg (179 lb), last menstrual period 07/23/2004.Body mass index is 33.02 kg/(m^2).  General Appearance: Disheveled  Eye Contact::  Minimal  Speech:  Normal Rate  Volume:  Normal  Mood:  Anxious  Affect:  Congruent  Thought Process:  Linear  Orientation:  Full (Time, Place, and Person)  Thought Content:  WDL  Suicidal Thoughts:  No  Homicidal Thoughts:  No  Memory:  Immediate;   Fair Recent;   Fair Remote;   Fair  Judgement:  Fair  Insight:  Lacking  Psychomotor Activity:  Decreased  Concentration:  Fair  Recall:  [employer] of Knowledge:Fair  Language: Good  Akathisia:  No  Handed:  Right  AIMS (if indicated):     Assets:  Communication Skills Desire for Improvement  Sleep:  Number of Hours: 6.75   Musculoskeletal: Strength & Muscle Tone:  within normal limits Gait & Station: normal Patient leans: N/A  Current Medications: Current Facility-Administered Medications  Medication Dose Route Frequency Provider Last Rate Last Dose  . acetaminophen (TYLENOL) tablet 650 mg  650 mg Oral Q6H PRN Elmarie Shiley, NP      . alum & mag hydroxide-simeth (MAALOX/MYLANTA) 200-200-20 MG/5ML suspension 30 mL  30 mL Oral Q4H PRN Elmarie Shiley, NP      . benztropine (COGENTIN) tablet 1 mg  1 mg Oral BID PRN Elmarie Shiley, NP      . carbamazepine (TEGRETOL XR) 12 hr tablet 200 mg  200 mg Oral BID Elmarie Shiley, NP   200 mg at 05/06/14 0851  . citalopram (CELEXA) tablet 40 mg  40 mg Oral QAC breakfast Elmarie Shiley, NP   40 mg at 05/06/14 0612  . gabapentin (NEURONTIN) capsule 200 mg  200 mg Oral TID Ursula Alert, MD   200 mg at 05/06/14 0851  . magnesium hydroxide (MILK OF MAGNESIA) suspension 30 mL  30 mL Oral Daily PRN Elmarie Shiley, NP      . mirtazapine (REMERON) tablet 7.5 mg  7.5 mg Oral QHS Ursula Alert, MD   7.5 mg at 05/05/14 2144  . risperiDONE (RISPERDAL) tablet 3 mg  3 mg Oral BID Elmarie Shiley, NP   3 mg at 05/06/14 1027  . simvastatin (ZOCOR) tablet 20 mg  20 mg Oral q1800 Ursula Alert, MD      . tuberculin injection 5 Units  5 Units Intradermal Once Ursula Alert, MD  5 Units at 05/05/14 1154    Lab Results:  Results for orders placed during the hospital encounter of 05/04/14 (from the past 48 hour(s))  BASIC METABOLIC PANEL     Status: Abnormal   Collection Time    05/05/14  7:27 PM      Result Value Ref Range   Sodium 137  137 - 147 mEq/L   Potassium 4.1  3.7 - 5.3 mEq/L   Chloride 95 (*) 96 - 112 mEq/L   CO2 29  19 - 32 mEq/L   Glucose, Bld 78  70 - 99 mg/dL   BUN 15  6 - 23 mg/dL   Creatinine, Ser 1.20 (*) 0.50 - 1.10 mg/dL   Calcium 9.4  8.4 - 10.5 mg/dL   GFR calc non Af Amer 50 (*) >90 mL/min   GFR calc Af Amer 58 (*) >90 mL/min   Comment: (NOTE)     The eGFR has been calculated using the CKD EPI equation.     This  calculation has not been validated in all clinical situations.     eGFR's persistently <90 mL/min signify possible Chronic Kidney     Disease.   Anion gap 13  5 - 15   Comment: Performed at [facility]  TSH     Status: None   Collection Time    05/06/14  6:28 AM      Result Value Ref Range   TSH 1.990  0.350 - 4.500 uIU/mL   Comment: Performed at [facility]  LIPID PANEL     Status: Abnormal   Collection Time    05/06/14  6:28 AM      Result Value Ref Range   Cholesterol 195  0 - 200 mg/dL   Triglycerides 89  <150 mg/dL   HDL 61  >39 mg/dL   Total CHOL/HDL Ratio 3.2     VLDL 18  0 - 40 mg/dL   LDL Cholesterol 116 (*) 0 - 99 mg/dL   Comment:            Total Cholesterol/HDL:CHD Risk     Coronary Heart Disease Risk Table                         Men   Women      1/2 Average Risk   3.4   3.3      Average Risk       5.0   4.4      2 X Average Risk   9.6   7.1      3 X Average Risk  23.4   11.0                Use the calculated Patient Ratio     above and the CHD Risk Table     to determine the patient's CHD Risk.                ATP III CLASSIFICATION (LDL):      <100     mg/dL   Optimal      100-129  mg/dL   Near or Above                        Optimal      130-159  mg/dL   Borderline      160-189  mg/dL   High      >190     mg/dL  Very High     Performed at [facility]    Physical Findings: AIMS: Facial and Oral Movements Muscles of Facial Expression: None, normal Lips and Perioral Area: None, normal Jaw: None, normal Tongue: None, normal,Extremity Movements Upper (arms, wrists, hands, fingers): None, normal Lower (legs, knees, ankles, toes): None, normal, Trunk Movements Neck, shoulders, hips: None, normal, Overall Severity Severity of abnormal movements (highest score from questions above): None, normal Incapacitation due to abnormal movements: None, normal Patient's awareness of abnormal movements (rate only patient's report):  No Awareness, Dental Status Current problems with teeth and/or dentures?: No Does patient usually wear dentures?: No  CIWA:  CIWA-Ar Total: 3 COWS:  COWS Total Score: 3  Treatment Plan Summary: Daily contact with patient to assess and evaluate symptoms and progress in treatment Medication management   Assessment : Patient decompensated following transition from ALF to apartment. Patient will need a structured supervised environment and will need to be placed. Patient currently doing well on her current medication regimen.  Plan: Will continue Tegretol XR 200 mg po bid for mood lability. Will continue Gabapentin 200 mg po bid for pain as well as anxiety. Will continue Celexa 40 mg for affective symptoms. Will continue Remeron 7.5 mg po qhs for sleep. Will continue Risperdal 3 mg po bid for psychosis. Will continue Cogentin 1 mg po bid for eps.  CSW will work on disposition.Patient to be referred to a structured place.   Medical Decision Making Problem Points:  Established problem, stable/improving (1) Data Points:  Review of medication regiment & side effects (2)  I certify that inpatient services furnished can reasonably be expected to improve the patient's condition.   Najae Filsaime MD 05/06/2014, 11:26 AM

## 2014-05-07 NOTE — ED Provider Notes (Signed)
Medical screening examination/treatment/procedure(s) were performed by non-physician practitioner and as supervising physician I was immediately available for consultation/collaboration.   EKG Interpretation None       Olivia Mackielga M Ashiya Kinkead, MD 05/07/14 [phone]

## 2014-05-07 NOTE — [facility] Group Notes (Signed)
[facility] LCSW Group Therapy  05/07/2014  1:05 PM  Type of Therapy:  Group therapy  Participation Level:  Active  Participation Quality:  Attentive  Affect:  Flat  Cognitive:  Oriented  Insight:  Limited  Engagement in Therapy:  Limited  Modes of Intervention:  Discussion, Socialization  Summary of Progress/Problems:  Chaplain was here to lead a group on themes of hope and courage.  "My ACT team is my main support.  They have always been there for me.  They have helped me with housing, and bring me my medication."  Sat quietly for much of group, but stayed the entire time.  Daryel Geraldorth, Jamielyn Petrucci B 05/07/2014 1:18 PM  '

## 2014-05-07 NOTE — [facility] Group Notes (Signed)
[facility]   05/07/2014 9:51 AM  Participation Quality:  Minimal   Mood/Affect:  Depressed and Flat  Depression Rating:  5  Anxiety Rating:  5  Thoughts of Suicide:  No Will you contract for safety?   NA  Current AVH:  No  Plan for Discharge/Comments:  Pt answering with one word statements during group. Resistant to discussion. Pt reports that she slept fine and meds are working well. Pt informed that [employer] rep may be coming to speak with her today.   Transportation Means: unknown at this time.   Supports: none identified by pt.   Smart, American FinancialHeather LCSWA

## 2014-05-07 NOTE — Progress Notes (Signed)
D: Pt denies SI/HI/AVH. Pt is pleasant and cooperative. Pt slept earlier in the evening, pt up on unit sparingly. Pt has minimal interaction, but will come to staff for anything.   A: Pt was offered support and encouragement. Pt was given scheduled medications. Pt was encourage to attend groups. Q 15 minute checks were done for safety.   R:. Pt is taking medication. Pt has no complaints.Pt receptive to treatment and safety maintained on unit.

## 2014-05-07 NOTE — [facility] Group Notes (Signed)
Adult Psychoeducational Group Note  Date:  05/07/2014 Time:  9:10 PM  Group Topic/Focus:  Wrap-Up Group:   The focus of this group is to help patients review their daily goal of treatment and discuss progress on daily workbooks.  Participation Level:  Minimal  Participation Quality:  Attentive  Affect:  Flat  Cognitive:  Appropriate  Insight: Good  Engagement in Group:  Limited  Modes of Intervention:  Discussion  Additional Comments:  Sheralyn Boatmanoni stated her day was ok.  She said everything went smoothly and that she was working on finding housing.  She also stated that she uses breathing exercises and journaling as coping skills.  Caroll RancherLindsay, Kolyn Rozario A 05/07/2014, 9:10 PM

## 2014-05-07 NOTE — Progress Notes (Signed)
[name]  Gets along well in the [employer] hall milieu. She tends to be quiet and reluctant to ask for anything and / or ask for help from staff. She wears the hospital-issued purple scrubs, saying " I've only got one other outfit to wear. She stays in the dayroom amongst her peers,watching TV, listening to others talk. SHe says " I don't like to cause trouble"...   A  She takes her meds as planned and she attends her groups as planned. She demonstrates insight about her illness and her life situation, telling this nurse " I know I need constant help"" I know I need somebody to check on me always. She completed her morning assessment and on it she wrote she denied SI within the past 24 hrs, she rated her depression, hopelessness and anxiety  "5/5/5" and she takes her medications as scheduled.   R Safety is in place.

## 2014-05-07 NOTE — Progress Notes (Signed)
[facility] MD Progress Note  05/07/2014 11:08 AM [name]  MRN:  [id number] Subjective: Patient states ,' I am fine". Objective: Patient seen and chart reviewed. Patient found in her room ,appears to be improving . Patient reports improvement with mood and is doing well on current medication regimen. Patient reports sleep as fair and appetite as fair.Patient denies SI/HI/AH/VH. Patient is compliant on medications. Patient denies any side effects. Per discussion with CSW -patient is minimally participating in milieu. Patient answers questions just in one word answers during groups and is not open to discussion. Patient was at an ALF ,from where she was moved to an apartment recently. However patient did not do well at that time ,quit taking care of her ADLs and ended up in the hospital.Patient will need placement at a structured environment.  CSW will continue to work on disposition.  Diagnosis:   DSM5: Primary Psychiatric Diagnosis: Schizoaffective disorder,depressive type ,multiple episodes ,currently in acute episode     Non Psychiatric Diagnosis: GERD Hyperlipidemia      ADL's:  Intact  Sleep: Fair  Appetite:  Fair   Psychiatric Specialty Exam: Physical Exam  ROS  Blood pressure 91/61, pulse 68, temperature 98.3 F (36.8 C), temperature source Oral, resp. rate 16, height 5' 1.75" (1.568 m), weight 81.194 kg (179 lb), last menstrual period 07/23/2004.Body mass index is 33.02 kg/(m^2).  General Appearance: Fairly Groomed  Engineer, water::  Minimal  Speech:  Normal Rate, answers questions in one word answers  Volume:  Normal  Mood:  Anxious improving  Affect:  Congruent  Thought Process:  Linear  Orientation:  Full (Time, Place, and Person)  Thought Content:  WDL  Suicidal Thoughts:  No  Homicidal Thoughts:  No  Memory:  Immediate;   Fair Recent;   Fair Remote;   Fair  Judgement:  Fair  Insight:  Lacking  Psychomotor Activity:  Decreased  Concentration:  Fair   Recall:  [employer] of Knowledge:Fair  Language: Good  Akathisia:  No  Handed:  Right  AIMS (if indicated):     Assets:  Communication Skills Desire for Improvement  Sleep:  Number of Hours: 6.5   Musculoskeletal: Strength & Muscle Tone: within normal limits Gait & Station: normal Patient leans: N/A  Current Medications: Current Facility-Administered Medications  Medication Dose Route Frequency Provider Last Rate Last Dose  . acetaminophen (TYLENOL) tablet 650 mg  650 mg Oral Q6H PRN Elmarie Shiley, NP      . alum & mag hydroxide-simeth (MAALOX/MYLANTA) 200-200-20 MG/5ML suspension 30 mL  30 mL Oral Q4H PRN Elmarie Shiley, NP      . benztropine (COGENTIN) tablet 0.5 mg  0.5 mg Oral BID Ursula Alert, MD   0.5 mg at 05/07/14 0739  . carbamazepine (TEGRETOL XR) 12 hr tablet 200 mg  200 mg Oral BID Elmarie Shiley, NP   200 mg at 05/07/14 0739  . citalopram (CELEXA) tablet 40 mg  40 mg Oral QAC breakfast Elmarie Shiley, NP   40 mg at 05/07/14 1751  . gabapentin (NEURONTIN) capsule 200 mg  200 mg Oral TID Ursula Alert, MD   200 mg at 05/07/14 0739  . magnesium hydroxide (MILK OF MAGNESIA) suspension 30 mL  30 mL Oral Daily PRN Elmarie Shiley, NP      . mirtazapine (REMERON) tablet 7.5 mg  7.5 mg Oral QHS Ursula Alert, MD   7.5 mg at 05/06/14 2217  . risperiDONE (RISPERDAL) tablet 3 mg  3 mg Oral BID Elmarie Shiley, NP  3 mg at 05/07/14 0739  . simvastatin (ZOCOR) tablet 20 mg  20 mg Oral q1800 Jomarie Longs, MD   20 mg at 05/06/14 1807  . tuberculin injection 5 Units  5 Units Intradermal Once Jomarie Longs, MD   5 Units at 05/05/14 1154    Lab Results:  Results for orders placed during the hospital encounter of 05/04/14 (from the past 48 hour(s))  BASIC METABOLIC PANEL     Status: Abnormal   Collection Time    05/05/14  7:27 PM      Result Value Ref Range   Sodium 137  137 - 147 mEq/L   Potassium 4.1  3.7 - 5.3 mEq/L   Chloride 95 (*) 96 - 112 mEq/L   CO2 29  19 - 32 mEq/L   Glucose, Bld  78  70 - 99 mg/dL   BUN 15  6 - 23 mg/dL   Creatinine, Ser 8.20 (*) 0.50 - 1.10 mg/dL   Calcium 9.4  8.4 - 60.1 mg/dL   GFR calc non Af Amer 50 (*) >90 mL/min   GFR calc Af Amer 58 (*) >90 mL/min   Comment: (NOTE)     The eGFR has been calculated using the CKD EPI equation.     This calculation has not been validated in all clinical situations.     eGFR's persistently <90 mL/min signify possible Chronic Kidney     Disease.   Anion gap 13  5 - 15   Comment: Performed at [facility]  TSH     Status: None   Collection Time    05/06/14  6:28 AM      Result Value Ref Range   TSH 1.990  0.350 - 4.500 uIU/mL   Comment: Performed at [facility]  HEMOGLOBIN A1C     Status: Abnormal   Collection Time    05/06/14  6:28 AM      Result Value Ref Range   Hemoglobin A1C 5.8 (*) <5.7 %   Comment: (NOTE)                                                                               According to the ADA Clinical Practice Recommendations for 2011, when     HbA1c is used as a screening test:      >=6.5%   Diagnostic of Diabetes Mellitus               (if abnormal result is confirmed)     5.7-6.4%   Increased risk of developing Diabetes Mellitus     References:Diagnosis and Classification of Diabetes Mellitus,Diabetes     Care,2011,34(Suppl 1):S62-S69 and Standards of Medical Care in             Diabetes - 2011,Diabetes Care,2011,34 (Suppl 1):S11-S61.   Mean Plasma Glucose 120 (*) <117 mg/dL   Comment: Performed at [facility]  LIPID PANEL     Status: Abnormal   Collection Time    05/06/14  6:28 AM      Result Value Ref Range   Cholesterol 195  0 - 200 mg/dL   Triglycerides 89  <561 mg/dL   HDL 61  >53 mg/dL  Total CHOL/HDL Ratio 3.2     VLDL 18  0 - 40 mg/dL   LDL Cholesterol 116 (*) 0 - 99 mg/dL   Comment:            Total Cholesterol/HDL:CHD Risk     Coronary Heart Disease Risk Table                         Men   Women      1/2 Average Risk   3.4    3.3      Average Risk       5.0   4.4      2 X Average Risk   9.6   7.1      3 X Average Risk  23.4   11.0                Use the calculated Patient Ratio     above and the CHD Risk Table     to determine the patient's CHD Risk.                ATP III CLASSIFICATION (LDL):      <100     mg/dL   Optimal      100-129  mg/dL   Near or Above                        Optimal      130-159  mg/dL   Borderline      160-189  mg/dL   High      >190     mg/dL   Very High     Performed at [facility]    Physical Findings: AIMS: Facial and Oral Movements Muscles of Facial Expression: None, normal Lips and Perioral Area: None, normal Jaw: None, normal Tongue: None, normal,Extremity Movements Upper (arms, wrists, hands, fingers): None, normal Lower (legs, knees, ankles, toes): None, normal, Trunk Movements Neck, shoulders, hips: None, normal, Overall Severity Severity of abnormal movements (highest score from questions above): None, normal Incapacitation due to abnormal movements: None, normal Patient's awareness of abnormal movements (rate only patient's report): No Awareness, Dental Status Current problems with teeth and/or dentures?: No Does patient usually wear dentures?: No  CIWA:  CIWA-Ar Total: 3 COWS:  COWS Total Score: 3  Treatment Plan Summary: Daily contact with patient to assess and evaluate symptoms and progress in treatment Medication management   Assessment : Patient decompensated following transition from ALF to apartment. Patient will need a structured supervised environment and will need to be placed. Patient currently doing well on her current medication regimen.  Plan: Will continue Tegretol XR 200 mg po bid for mood lability. Will continue Gabapentin 200 mg po bid for pain as well as anxiety. Will continue Celexa 40 mg for affective symptoms. Will continue Remeron 7.5 mg po qhs for sleep. Will continue Risperdal 3 mg po bid for psychosis. Will continue  Cogentin 0.5 mg po bid for eps. Zocor 20 mg po daily added for hyperlipidemia.  CSW will work on disposition.Patient to be referred to a structured place.   Medical Decision Making Problem Points:  Established problem, stable/improving (1) Data Points:  Review of medication regiment & side effects (2)  I certify that inpatient services furnished can reasonably be expected to improve the patient's condition.   Kleber Crean MD 05/07/2014, 11:08 AM

## 2014-05-07 NOTE — Progress Notes (Signed)
[facility] Group Notes:  (Nursing/MHT/Case Management/Adjunct)  Date:  05/07/2014  Time:  1:27 PM  Type of Therapy:  Therapeutic Activity  Participation Level:  Minimal  Summary of Progress/Problems:  Pt came to group, but was soon called out to speak with the doctor.  Everrett Coombewen, Meleni Delahunt C 05/07/2014, 1:27 PM

## 2014-05-08 DIAGNOSIS — F251 Schizoaffective disorder, depressive type: Principal | ICD-10-CM

## 2014-05-08 DIAGNOSIS — K219 Gastro-esophageal reflux disease without esophagitis: Secondary | ICD-10-CM

## 2014-05-08 DIAGNOSIS — E785 Hyperlipidemia, unspecified: Secondary | ICD-10-CM

## 2014-05-08 NOTE — Progress Notes (Signed)
D: Pt denies SI/HI/AVH. Pt is pleasant and cooperative. Pt plans to go to a group home when D/C. Pt brightens on approach, but continues to be blunted.   A: Pt was offered support and encouragement. Pt was given scheduled medications. Pt was encourage to attend groups. Q 15 minute checks were done for safety.   R:Pt attends groups and interacts well with peers and staff. Pt is taking medication. Pt has no complaints at ths itime.Pt receptive to treatment and safety maintained on unit.

## 2014-05-08 NOTE — Progress Notes (Signed)
[facility] Group Notes:  (Nursing/MHT/Case Management/Adjunct)  Date:  05/08/2014  Time:  9:39 PM  Type of Therapy:  Psychoeducational Skills  Participation Level:  Minimal  Participation Quality:  Resistant  Affect:  Flat  Cognitive:  Lacking  Insight:  Limited  Engagement in Group:  Resistant  Modes of Intervention:  Education  Summary of Progress/Problems: The patient described her day as having been "okay", but she would not explain any further. This author asked the patient different questions but she would not respond. As for the theme for the day, her coping skill is to listen to music.   Hazle CocaGOODMAN, Forbes Loll S 05/08/2014, 9:39 PM

## 2014-05-08 NOTE — Plan of Care (Signed)
Problem: Ineffective individual coping Goal: STG: Patient will remain free from self harm Outcome: Progressing AEB pt assessment  Problem: Diagnosis: Increased Risk For Suicide Attempt Goal: LTG-Patient Will Show Positive Response to Medication LTG (by discharge) : Patient will show positive response to medication and will participate in the development of the discharge plan.  Outcome: Progressing Pt stated she has felt a little better since coming here, pt was observed on unit a little more than the previous night.

## 2014-05-08 NOTE — [facility] Group Notes (Signed)
[facility] Group Notes:  (Clinical Social Work)  05/08/2014  11:15-12:00PM  Summary of Progress/Problems:   The main focus of today's process group was to discuss patients' feelings about hospitalization, the stigma attached to mental health, and sources of motivation to stay well.  We then worked to identify a specific plan to avoid future hospitalizations when discharged from the hospital for this admission.  The patient expressed little during group, had a flat affect throughout group.  She did appear to be paying attention, however.  Type of Therapy:  Group Therapy - Process  Participation Level:  Active  Participation Quality:  Attentive  Affect:  Flat  Cognitive:  Alert  Insight:  Limited  Engagement in Therapy:  Limited  Modes of Intervention:  Exploration, Discussion  Ambrose MantleMareida Grossman-Orr, LCSW 05/08/2014, 1:17 PM

## 2014-05-08 NOTE — Progress Notes (Signed)
Patient ID: [name], female   DOB: July 22, 1961, 53 y.o.   MRN: [id number] [facility]  05/08/2014 3:07 PM [name]  MRN:  [id number] Subjective: Patient states ,'I am doing better. I'm not thinking of hurting myself like I was. I'm really worried about having nowhere to go. I do not have much support. I have an interview with a group home on Monday."   Objective:  Patient is active on the unit and attending the scheduled groups but minimally. Patient decompensated recently after moving into an apartment. She has told staff that she feels the need for constant supervision. Her affect is very flat and she offers one word answers to questions. The patient feels that a group home would be a good fit for her. She is compliant with her medications and denies any adverse effects. Patient is attending to her ADL's on the unit. She appears to function well in a structured environment. Continues to appear somewhat depressed. Rates her level of depression at five today.   Diagnosis:   DSM5: Primary Psychiatric Diagnosis: Schizoaffective disorder,depressive type ,multiple episodes ,currently in acute episode  Non Psychiatric Diagnosis: GERD Hyperlipidemia  ADL's:  Intact  Sleep: Fair  Appetite:  Fair   Psychiatric Specialty Exam: Physical Exam  Review of Systems  Constitutional: Negative for fever, chills, weight loss, malaise/fatigue and diaphoresis.  HENT: Negative for congestion, ear discharge, ear pain, hearing loss, nosebleeds and tinnitus.   Eyes: Negative for blurred vision, double vision, photophobia, pain, discharge and redness.  Respiratory: Negative for cough, hemoptysis, sputum production, shortness of breath and stridor.   Cardiovascular: Negative for chest pain, palpitations, orthopnea, claudication and leg swelling.  Gastrointestinal: Negative for heartburn, nausea, vomiting, abdominal pain, diarrhea, constipation and blood in stool.  Genitourinary: Negative for  dysuria, urgency, frequency and hematuria.  Musculoskeletal: Negative for back pain, joint pain, myalgias and neck pain.  Skin: Negative for itching and rash.  Neurological: Negative for dizziness, tingling, tremors, sensory change, speech change, focal weakness, seizures, weakness and headaches.  Endo/Heme/Allergies: Negative for environmental allergies and polydipsia. Does not bruise/bleed easily.  Psychiatric/Behavioral: Positive for depression. The patient is nervous/anxious.     Blood pressure 91/61, pulse 68, temperature 98.3 F (36.8 C), temperature source Oral, resp. rate 16, height 5' 1.75" (1.568 m), weight 81.194 kg (179 lb), last menstrual period 07/23/2004.Body mass index is 33.02 kg/(m^2).  General Appearance: Fairly Groomed  Patent attorneyye Contact::  Minimal  Speech:  Normal Rate  Volume:  Normal  Mood:  Anxious   Affect:  Depressed  Thought Process:  Linear  Orientation:  Full (Time, Place, and Person)  Thought Content:  WDL  Suicidal Thoughts:  No  Homicidal Thoughts:  No  Memory:  Immediate;   Fair Recent;   Fair Remote;   Fair  Judgement:  Fair  Insight:  Lacking  Psychomotor Activity:  Decreased  Concentration:  Fair  Recall:  [employer]  Fund of Knowledge:Fair  Language: Good  Akathisia:  No  Handed:  Right  AIMS (if indicated):     Assets:  Communication Skills Desire for Improvement  Sleep:  Number of Hours: 6.5   Musculoskeletal: Strength & Muscle Tone: within normal limits Gait & Station: normal Patient leans: N/A  Current Medications: Current Facility-Administered Medications  Medication Dose Route Frequency Provider Last Rate Last Dose  . acetaminophen (TYLENOL) tablet 650 mg  650 mg Oral Q6H PRN Fransisca KaufmannLaura Davis, NP      . alum & mag hydroxide-simeth (MAALOX/MYLANTA) 200-200-20 MG/5ML suspension 30  mL  30 mL Oral Q4H PRN Fransisca KaufmannLaura Davis, NP      . benztropine (COGENTIN) tablet 0.5 mg  0.5 mg Oral BID Jomarie LongsSaramma Eappen, MD   0.5 mg at 05/08/14 0723  . carbamazepine  (TEGRETOL XR) 12 hr tablet 200 mg  200 mg Oral BID Fransisca KaufmannLaura Davis, NP   200 mg at 05/08/14 0723  . citalopram (CELEXA) tablet 40 mg  40 mg Oral QAC breakfast Fransisca KaufmannLaura Davis, NP   40 mg at 05/08/14 [id number]  . gabapentin (NEURONTIN) capsule 200 mg  200 mg Oral TID Jomarie LongsSaramma Eappen, MD   200 mg at 05/08/14 1204  . magnesium hydroxide (MILK OF MAGNESIA) suspension 30 mL  30 mL Oral Daily PRN Fransisca KaufmannLaura Davis, NP      . mirtazapine (REMERON) tablet 7.5 mg  7.5 mg Oral QHS Jomarie LongsSaramma Eappen, MD   7.5 mg at 05/07/14 2311  . risperiDONE (RISPERDAL) tablet 3 mg  3 mg Oral BID Fransisca KaufmannLaura Davis, NP   3 mg at 05/08/14 0723  . simvastatin (ZOCOR) tablet 20 mg  20 mg Oral q1800 Jomarie LongsSaramma Eappen, MD   20 mg at 05/07/14 1704    Lab Results:  No results found for this or any previous visit (from the past 48 hour(s)).  Physical Findings: AIMS: Facial and Oral Movements Muscles of Facial Expression: None, normal Lips and Perioral Area: None, normal Jaw: None, normal Tongue: None, normal,Extremity Movements Upper (arms, wrists, hands, fingers): None, normal Lower (legs, knees, ankles, toes): None, normal, Trunk Movements Neck, shoulders, hips: None, normal, Overall Severity Severity of abnormal movements (highest score from questions above): None, normal Incapacitation due to abnormal movements: None, normal Patient's awareness of abnormal movements (rate only patient's report): No Awareness, Dental Status Current problems with teeth and/or dentures?: No Does patient usually wear dentures?: No  CIWA:  CIWA-Ar Total: 3 COWS:  COWS Total Score: 3  Treatment Plan Summary: Daily contact with patient to assess and evaluate symptoms and progress in treatment Medication management  Plan: 1. Will continue Tegretol XR 200 mg po bid for mood lability. 2. Will continue Gabapentin 200 mg po bid for pain as well as anxiety. 3. Will continue Celexa 40 mg for affective symptoms. 4. Will continue Remeron 7.5 mg po qhs for sleep. 5. Will  continue Risperdal 3 mg po bid for psychosis. 6. Will continue Cogentin 0.5 mg po bid for eps. 7. Zocor 20 mg po daily added for hyperlipidemia. 8. CSW will work on disposition.Patient to be referred to a structured group home placement.  9. Tegretol level on 05/10/14.   Medical Decision Making Problem Points:  Established problem, stable/improving (1), Review of last therapy session (1) and Review of psycho-social stressors (1) Data Points:  Review or order clinical lab tests (1) Review of medication regiment & side effects (2)  I certify that inpatient services furnished can reasonably be expected to improve the patient's condition.   DAVIS, LAURA NP-C 05/08/2014, 3:07 PM  Reviewed the information documented and agree with the treatment plan.  Jenin Birdsall,JANARDHAHA R. 05/09/2014 2:18 PM

## 2014-05-08 NOTE — Plan of Care (Signed)
Problem: Ineffective individual coping Goal: LTG: Patient will report a decrease in negative feelings Outcome: Progressing Pt stated she felt a little better today. Goal: STG: Patient will remain free from self harm Outcome: Progressing Pt continues to be safe on the unit AEB assessment  Problem: Diagnosis: Increased Risk For Suicide Attempt Goal: LTG-Patient Will Report Improved Mood and Deny Suicidal LTG (by discharge) Patient will report improved mood and deny suicidal ideation.  Outcome: Progressing Pt has slight improvement in mood AEB pt statement and pt denies SI

## 2014-05-08 NOTE — Progress Notes (Signed)
Patient ID: [name], female   DOB: 12-17-1960, 53 y.o.   MRN: [id number]   D: Pt has been very flat and depressed on the unit today. Pt attended all groups and attempted to engage in treatment. Pt does not engage much with staff or her peers. Pt took all medications without any problems. Pt reported being negative SI/HI, no AH/VH noted. A: 15 min checks continued for patient safety. R: Pt safety maintained.

## 2014-05-09 NOTE — [facility] Group Notes (Signed)
[facility] Group Notes:  (Nursing/MHT/Case Management/Adjunct)  Date:  05/09/2014  Time:  09:00  Type of Therapy: Psychoeducational Skills  Participation Level:  Minimal  Participation Quality:  Attentive  Affect:  Appropriate  Cognitive:  Appropriate  Insight:  Good  Engagement in Group:  Limited  Modes of Intervention:  Education  Summary of Progress/Problems:  Pt. Participated and is begining to be more active in group.   Oliva BustardShimp, Shaquan Puerta Larraine 05/09/2014, 5:01 PM

## 2014-05-09 NOTE — Progress Notes (Signed)
Patient ID: [name], female   DOB: 12-21-1960, 53 y.o.   MRN: [id number] She has been up and to groups today. Has had some interaction with staff but tends to keep to her self when around peers. She denies having any problems. Self inventory this AM: depression 1, hopelessness ands anxiety 0's, denies SI thoughts and pain. She has set no goals today.

## 2014-05-09 NOTE — [facility] Group Notes (Signed)
Adult Psychoeducational Group Note  Date:  05/09/2014 Time:  9:02 PM  Group Topic/Focus:  Wrap Up Group  Participation Level:  Active  Participation Quality:  Appropriate  Affect:  Appropriate  Cognitive:  Appropriate  Insight: Appropriate  Engagement in Group:  Engaged  Modes of Intervention:  Discussion  Additional Comments: Pt stated that she had Duncan good day. Her goal was to go to all her groups and she did.  [name], [name]

## 2014-05-09 NOTE — [facility] Group Notes (Signed)
[facility] Group Notes:  (Clinical Social Work)  05/09/2014   11:15am-12:00pm  Summary of Progress/Problems:  The main focus of today's process group was to listen to a variety of genres of music and to identify that different types of music provoke different responses.  The patient then was able to identify personally what was soothing for them, as well as energizing.  The patient expressed understanding of concepts, as well as knowledge of how each type of music affected him/her and how this can be used at home as a wellness/recovery tool.  At the beginning of group, she was unable to identify her overall mood.  Throughout group, she was unable to identify her feelings about any piece of music.  At the end of group, she stated her anxiety had been reduced to 1 out of 10.  Type of Therapy:  Music Therapy   Participation Level:  Active  Participation Quality:  Attentive  Affect:  Flat  Cognitive:  Confused  Insight:  Poor  Engagement in Therapy:  Engaged  Modes of Intervention:   Activity, Exploration  Ambrose MantleMareida Grossman-Orr, LCSW 05/09/2014, 12:30pm

## 2014-05-09 NOTE — Progress Notes (Signed)
Patient ID: Rebecca Fueloni Gange, female   DOB: 02-13-1961, 53 y.o.   MR[facility]: [id number] [facility]  05/09/2014 2:56 PM [name]  MRN:  [id number] Subjective: Patient states "I'm doing better. I'm just anxious about tomorrow. I am meeting with the lady from the group home. I need a place to stay."  Objective:  Patient is visible on the unit today. She is guarded during interactions. Patient is hesitant to elaborate on her symptoms. She appears to become more anxious when being interviewed. Patient appears to have trouble processing information. Notes from group today indicate the patient seemed confused about the questions asked about response to different type of music. She remains compliant with medications. Is scheduled per treatment team for group home interview tomorrow.   Diagnosis:   DSM5: Primary Psychiatric Diagnosis: Schizoaffective disorder,depressive type ,multiple episodes ,currently in acute episode  Non Psychiatric Diagnosis: GERD Hyperlipidemia  ADL's:  Intact  Sleep: Fair  Appetite:  Fair   Psychiatric Specialty Exam: Physical Exam  Review of Systems  Constitutional: Negative for fever, chills, weight loss, malaise/fatigue and diaphoresis.  HENT: Negative for congestion, ear discharge, ear pain, hearing loss, nosebleeds and tinnitus.   Eyes: Negative for blurred vision, double vision, photophobia, pain, discharge and redness.  Respiratory: Negative for cough, hemoptysis, sputum production, shortness of breath and stridor.   Cardiovascular: Negative for chest pain, palpitations, orthopnea, claudication and leg swelling.  Gastrointestinal: Negative for heartburn, nausea, vomiting, abdominal pain, diarrhea, constipation and blood in stool.  Genitourinary: Negative for dysuria, urgency, frequency and hematuria.  Musculoskeletal: Negative for back pain, joint pain, myalgias and neck pain.  Skin: Negative for itching and rash.  Neurological: Negative for dizziness,  tingling, tremors, sensory change, speech change, focal weakness, seizures, weakness and headaches.  Endo/Heme/Allergies: Negative for environmental allergies and polydipsia. Does not bruise/bleed easily.  Psychiatric/Behavioral: Positive for depression. The patient is nervous/anxious.     Blood pressure 94/64, pulse 76, temperature 98.4 F (36.9 C), temperature source Oral, resp. rate 16, height 5' 1.75" (1.568 m), weight 81.194 kg (179 lb), last menstrual period 07/23/2004.Body mass index is 33.02 kg/(m^2).  General Appearance: Fairly Groomed  Patent attorneyye Contact::  Minimal  Speech:  Normal Rate  Volume:  Normal  Mood:  Anxious   Affect:  Depressed  Thought Process:  Linear  Orientation:  Full (Time, Place, and Person)  Thought Content:  WDL  Suicidal Thoughts:  No  Homicidal Thoughts:  No  Memory:  Immediate;   Fair Recent;   Fair Remote;   Fair  Judgement:  Fair  Insight:  Lacking  Psychomotor Activity:  Decreased  Concentration:  Fair  Recall:  [employer]Fair  Fund of Knowledge:Fair  Language: Good  Akathisia:  No  Handed:  Right  AIMS (if indicated):     Assets:  Communication Skills Desire for Improvement  Sleep:  Number of Hours: 4.75   Musculoskeletal: Strength & Muscle Tone: within normal limits Gait & Station: normal Patient leans: N/A  Current Medications: Current Facility-Administered Medications  Medication Dose Route Frequency Provider Last Rate Last Dose  . acetaminophen (TYLENOL) tablet 650 mg  650 mg Oral Q6H PRN Fransisca KaufmannLaura Davis, NP      . alum & mag hydroxide-simeth (MAALOX/MYLANTA) 200-200-20 MG/5ML suspension 30 mL  30 mL Oral Q4H PRN Fransisca KaufmannLaura Davis, NP      . benztropine (COGENTIN) tablet 0.5 mg  0.5 mg Oral BID Jomarie LongsSaramma Eappen, MD   0.5 mg at 05/09/14 0930  . carbamazepine (TEGRETOL XR) 12 hr tablet 200  mg  200 mg Oral BID Fransisca KaufmannLaura Davis, NP   200 mg at 05/09/14 [id number]  . citalopram (CELEXA) tablet 40 mg  40 mg Oral QAC breakfast Fransisca KaufmannLaura Davis, NP   40 mg at 05/09/14 [id number]  .  gabapentin (NEURONTIN) capsule 200 mg  200 mg Oral TID Jomarie LongsSaramma Eappen, MD   200 mg at 05/09/14 1153  . magnesium hydroxide (MILK OF MAGNESIA) suspension 30 mL  30 mL Oral Daily PRN Fransisca KaufmannLaura Davis, NP      . mirtazapine (REMERON) tablet 7.5 mg  7.5 mg Oral QHS Jomarie LongsSaramma Eappen, MD   7.5 mg at 05/08/14 2137  . risperiDONE (RISPERDAL) tablet 3 mg  3 mg Oral BID Fransisca KaufmannLaura Davis, NP   3 mg at 05/09/14 0929  . simvastatin (ZOCOR) tablet 20 mg  20 mg Oral q1800 Jomarie LongsSaramma Eappen, MD   20 mg at 05/08/14 1603    Lab Results:  No results found for this or any previous visit (from the past 48 hour(s)).  Physical Findings: AIMS: Facial and Oral Movements Muscles of Facial Expression: None, normal Lips and Perioral Area: None, normal Jaw: None, normal Tongue: None, normal,Extremity Movements Upper (arms, wrists, hands, fingers): None, normal Lower (legs, knees, ankles, toes): None, normal, Trunk Movements Neck, shoulders, hips: None, normal, Overall Severity Severity of abnormal movements (highest score from questions above): None, normal Incapacitation due to abnormal movements: None, normal Patient's awareness of abnormal movements (rate only patient's report): No Awareness, Dental Status Current problems with teeth and/or dentures?: No Does patient usually wear dentures?: No  CIWA:  CIWA-Ar Total: 3 COWS:  COWS Total Score: 3  Treatment Plan Summary: Daily contact with patient to assess and evaluate symptoms and progress in treatment Medication management  Plan: 1. Will continue Tegretol XR 200 mg po bid for mood lability. 2. Will continue Gabapentin 200 mg po bid for pain as well as anxiety. 3. Will continue Celexa 40 mg for affective symptoms. 4. Will continue Remeron 7.5 mg po qhs for sleep. 5. Will continue Risperdal 3 mg po bid for psychosis. 6. Will continue Cogentin 0.5 mg po bid for eps. 7. Will continue Zocor 20 mg po daily for hyperlipidemia. 8. CSW will work on disposition.Patient to be  referred to a structured group home placement.  9. Tegretol level on 05/10/14.   Medical Decision Making Problem Points:  Established problem, stable/improving (1), Review of last therapy session (1) and Review of psycho-social stressors (1) Data Points:  Review or order clinical lab tests (1) Review of medication regiment & side effects (2)  I certify that inpatient services furnished can reasonably be expected to improve the patient's condition.   DAVIS, LAURA NP-C 05/09/2014, 2:56 PM  Reviewed the information documented and agree with the treatment plan.  Lashaya Kienitz,JANARDHAHA R. 05/10/2014 8:18 AM

## 2014-05-10 LAB — CARBAMAZEPINE LEVEL, TOTAL: Carbamazepine Lvl: 4.8 ug/mL (ref 4.0–12.0)

## 2014-05-10 MED ORDER — ONDANSETRON 4 MG PO TBDP: 4.0000 mg | ORAL_TABLET | Freq: Three times a day (TID) | ORAL | Status: DC | PRN

## 2014-05-10 NOTE — Progress Notes (Signed)
Patient ID: [name], female   DOB: Mar 31, 1961, 53 y.o.   MRN: [id number] PER STATE REGULATIONS 482.30  THIS CHART WAS REVIEWED FOR MEDICAL NECESSITY WITH RESPECT TO THE PATIENT'S ADMISSION/ DURATION OF STAY.  NEXT REVIEW DATE: 05/11/2014  [name] Jared Cahn, RN, BSN CASE MANAGER

## 2014-05-10 NOTE — Progress Notes (Signed)
Patient ID: [name], female   DOB: 26-Aug-1960, 53 y.o.   MR[facility]: [id number] [facility]  05/10/2014 2:32 PM [name]  MRN:  [id number] Subjective: Patient states "I'm doing ok".  Objective:  Patient seen and chart reviewed.She continues to be guarded. Patient continues to deny any symptoms and is unable to elaborate . Patient is awaiting placement. Per csw will continue working on disposition. Patient reports sleep as fair and appetite as good Patient denies SI/HI/AH/VH. Patient is compliant on medications. Patient denies any side effects.    Diagnosis:   DSM5: Primary Psychiatric Diagnosis: Schizoaffective disorder,depressive type ,multiple episodes ,currently in acute episode  Non Psychiatric Diagnosis: GERD Hyperlipidemia  ADL's:  Intact  Sleep: Fair  Appetite:  Fair   Psychiatric Specialty Exam: Physical Exam  Review of Systems  Constitutional: Negative for fever, chills, weight loss, malaise/fatigue and diaphoresis.  HENT: Negative for congestion, ear discharge, ear pain, hearing loss, nosebleeds and tinnitus.   Eyes: Negative for blurred vision, double vision, photophobia, pain, discharge and redness.  Respiratory: Negative for cough, hemoptysis, sputum production, shortness of breath and stridor.   Cardiovascular: Negative for chest pain, palpitations, orthopnea, claudication and leg swelling.  Gastrointestinal: Negative for heartburn, nausea, vomiting, abdominal pain, diarrhea, constipation and blood in stool.  Genitourinary: Negative for dysuria, urgency, frequency and hematuria.  Musculoskeletal: Negative for back pain, joint pain, myalgias and neck pain.  Skin: Negative for itching and rash.  Neurological: Negative for dizziness, tingling, tremors, sensory change, speech change, focal weakness, seizures, weakness and headaches.  Endo/Heme/Allergies: Negative for environmental allergies and polydipsia. Does not bruise/bleed easily.   Psychiatric/Behavioral: Positive for depression (improving). The patient is nervous/anxious.     Blood pressure 94/74, pulse 78, temperature 97.4 F (36.3 C), temperature source Oral, resp. rate 16, height 5' 1.75" (1.568 m), weight 81.194 kg (179 lb), last menstrual period 07/23/2004.Body mass index is 33.02 kg/(m^2).  General Appearance: Fairly Groomed  Patent attorneyye Contact::  Minimal  Speech:  Normal Rate  Volume:  Normal  Mood:  Anxious improving  Affect:  Depressed  Thought Process:  Linear  Orientation:  Full (Time, Place, and Person)  Thought Content:  WDL  Suicidal Thoughts:  No  Homicidal Thoughts:  No  Memory:  Immediate;   Fair Recent;   Fair Remote;   Fair  Judgement:  Fair  Insight:  Lacking  Psychomotor Activity:  Decreased  Concentration:  Fair  Recall:  [employer]Fair  Fund of Knowledge:Fair  Language: Good  Akathisia:  No  Handed:  Right  AIMS (if indicated):     Assets:  Communication Skills Desire for Improvement  Sleep:  Number of Hours: 6.5   Musculoskeletal: Strength & Muscle Tone: within normal limits Gait & Station: normal Patient leans: N/A  Current Medications: Current Facility-Administered Medications  Medication Dose Route Frequency Provider Last Rate Last Dose  . acetaminophen (TYLENOL) tablet 650 mg  650 mg Oral Q6H PRN Fransisca KaufmannLaura Davis, NP      . alum & mag hydroxide-simeth (MAALOX/MYLANTA) 200-200-20 MG/5ML suspension 30 mL  30 mL Oral Q4H PRN Fransisca KaufmannLaura Davis, NP      . benztropine (COGENTIN) tablet 0.5 mg  0.5 mg Oral BID Jomarie LongsSaramma Tirrell Buchberger, MD   0.5 mg at 05/10/14 0848  . carbamazepine (TEGRETOL XR) 12 hr tablet 200 mg  200 mg Oral BID Fransisca KaufmannLaura Davis, NP   200 mg at 05/10/14 0848  . citalopram (CELEXA) tablet 40 mg  40 mg Oral QAC breakfast Fransisca KaufmannLaura Davis, NP   40 mg  at 05/10/14 0639  . gabapentin (NEURONTIN) capsule 200 mg  200 mg Oral TID Jomarie LongsSaramma Warnie Belair, MD   200 mg at 05/10/14 1211  . magnesium hydroxide (MILK OF MAGNESIA) suspension 30 mL  30 mL Oral Daily PRN Fransisca KaufmannLaura  Davis, NP      . mirtazapine (REMERON) tablet 7.5 mg  7.5 mg Oral QHS Jomarie LongsSaramma Julionna Marczak, MD   7.5 mg at 05/09/14 2124  . risperiDONE (RISPERDAL) tablet 3 mg  3 mg Oral BID Fransisca KaufmannLaura Davis, NP   3 mg at 05/10/14 0848  . simvastatin (ZOCOR) tablet 20 mg  20 mg Oral q1800 Jomarie LongsSaramma Brendalee Matthies, MD   20 mg at 05/09/14 1726    Lab Results:  Results for orders placed during the hospital encounter of 05/04/14 (from the past 48 hour(s))  CARBAMAZEPINE LEVEL, TOTAL     Status: None   Collection Time    05/10/14  6:20 AM      Result Value Ref Range   Carbamazepine Lvl 4.8  4.0 - 12.0 ug/mL   Comment[facility]    Physical Findings: AIMS: Facial and Oral Movements Muscles of Facial Expression: None, normal Lips and Perioral Area: None, normal Jaw: None, normal Tongue: None, normal,Extremity Movements Upper (arms, wrists, hands, fingers): None, normal Lower (legs, knees, ankles, toes): None, normal, Trunk Movements Neck, shoulders, hips: None, normal, Overall Severity Severity of abnormal movements (highest score from questions above): None, normal Incapacitation due to abnormal movements: None, normal Patient's awareness of abnormal movements (rate only patient's report): No Awareness, Dental Status Current problems with teeth and/or dentures?: No Does patient usually wear dentures?: No  CIWA:  CIWA-Ar Total: 3 COWS:  COWS Total Score: 3  Treatment Plan Summary: Daily contact with patient to assess and evaluate symptoms and progress in treatment Medication management  Plan: 1. Will continue Tegretol XR 200 mg po bid for mood lability. 2. Will continue Gabapentin 200 mg po bid for pain as well as anxiety. 3. Will continue Celexa 40 mg for affective symptoms. 4. Will continue Remeron 7.5 mg po qhs for sleep. 5. Will continue Risperdal 3 mg po bid for psychosis. 6. Will continue Cogentin 0.5 mg po bid for eps. 7. Will continue Zocor 20 mg po daily for hyperlipidemia. 8. CSW  will work on disposition.Patient to be referred to a structured group home placement.  9. Tegretol level on 05/10/14- 4.8.   Medical Decision Making Problem Points:  Established problem, stable/improving (1), Review of last therapy session (1) and Review of psycho-social stressors (1) Data Points:  Review or order clinical lab tests (1) Review of medication regiment & side effects (2)  I certify that inpatient services furnished can reasonably be expected to improve the patient's condition.   Shaqueta Casady MD 05/10/2014 2:32 PM

## 2014-05-10 NOTE — Progress Notes (Signed)
Patient ID: [name], female   DOB: 1960/09/18, 53 y.o.   MRN: [id number]  D: On first approach patient is sleeping and is requested to come to window. Patient appears anxious with glaring eye contact. Patient is cooperative with Clinical research associatewriter during medication administration. Pt. Denies SI/HI and A/V Hallucinations to this Clinical research associatewriter. Patient does not report any pain or discomfort at this time.   A: Support and encouragement provided to the patient to go to groups, fill out daily inventory sheet, and continue following treatment plan. Patient is taking medications as prescribed at this time with no adverse effects noted or reported.   R: Patient is minimal and forwards little. Patient is not attending groups and is only seen in the milieu minimally. Patient did not fill out daily inventory sheet although encouraged.  Q15 minute checks are maintained for safety.

## 2014-05-10 NOTE — Progress Notes (Signed)
D: Pt reports having a "so-so" day today. Pt was minimal in interaction with this Clinical research associatewriter and others. Pt was present for group this evening.  Pt reports the goal of maintaining her current group attendance. Pt is currently denying any SI/HI/AVH.  A: Writer administered scheduled medications to pt, per MD orders. Continued support and availability as needed was extended to this pt. Staff continue to monitor pt with q2115min checks.  R: No adverse drug reactions noted. Pt receptive to treatment. Pt remains safe at this time.

## 2014-05-10 NOTE — [facility] Group Notes (Signed)
[facility]   05/10/2014 11:43 AM  Participation Quality:  Minimal  Mood/Affect:  Flat  Depression Rating:  5  Anxiety Rating:  5  Thoughts of Suicide:  No Will you contract for safety?   NA  Current AVH:  Denies  Plan for Discharge/Comments:  [name] was happily eating ice cream during group.  One word responses to questions.  Weekend was "good."  No visitors.  [facility]  Transportation Means: ACT team  Supports: ACT team  [name], [name]

## 2014-05-10 NOTE — Tx Team (Signed)
  Interdisciplinary Treatment Plan Update   Date Reviewed:  05/10/2014  Time Reviewed:  8:11 AM  Progress in Treatment:   Attending groups: Yes Participating in groups: Yes Taking medication as prescribed: Yes  Tolerating medication: Yes Family/Significant other contact made: Yes  Patient understands diagnosis: Yes  Discussing patient identified problems/goals with staff: Yes  See initial care plan Medical problems stabilized or resolved: Yes Denies suicidal/homicidal ideation: Yes  In tx team Patient has not harmed self or others: Yes  For review of initial/current patient goals, please see plan of care.  Estimated Length of Stay:  3-5 days  Reason for Continuation of Hospitalization: Depression Hallucinations Medication stabilization  New Problems/Goals identified:  N/A  Discharge Plan or Barriers:   Referral to ALF, GH  Additional Comments:  Patient is active on the unit and attending the scheduled groups but minimally. Patient decompensated recently after moving into an apartment. She has told staff that she feels the need for constant supervision. Her affect is very flat and she offers one word answers to questions. The patient feels that a group home would be a good fit for her. She is compliant with her medications and denies any adverse effects.She appears to function well in a structured environment. Continues to appear somewhat depressed. Rates her level of depression at five today.  Today Sheralyn Boatmanoni was asked by several team members to shower as she smelled bad.  She agreed, and followed through.  Punctuates the importance of her being in a structured setting where she can held accountable and structured to follow through on hygiene and other basic expectations.     Attendees:  Signature: Ivin BootySarama Eappen, MD 05/10/2014 8:11 AM   Signature: Richelle Itood Jontay Maston, LCSW 05/10/2014 8:11 AM  Signature:  05/10/2014 8:11 AM  Signature: Marzetta Boardhrista Dopson, RN 05/10/2014 8:11 AM  Signature:   05/10/2014 8:11 AM  Signature:  05/10/2014 8:11 AM  Signature:   05/10/2014 8:11 AM  Signature:    Signature:    Signature:    Signature:    Signature:    Signature:      Scribe for Treatment Team:   [employer] Ahniyah Giancola, LCSW  05/10/2014 8:11 AM

## 2014-05-10 NOTE — [facility] Group Notes (Signed)
[facility] LCSW Group Therapy  05/10/2014 1:48 PM  Type of Therapy:  Group Therapy  Participation Level: Invited--  Did Not Attend  Smart, Aris Even LCSWA 05/10/2014, 1:48 PM

## 2014-05-10 NOTE — Plan of Care (Signed)
Problem: Diagnosis: Increased Risk For Suicide Attempt Goal: STG-Patient Will Comply With Medication Regime Outcome: Completed/Met Date Met:  05/10/14 Patient is compliant with current medication regimen

## 2014-05-11 NOTE — Progress Notes (Signed)
D: Patient presents with appropriate affect and depressed mood. She reported on the self inventory sheet that she's sleeping fair, good appetite and ability to concentrate and normal energy level. Patient rates depression, feelings of hopelessness and anxiety "1". She's been participating in groups and sitting in the dayroom the majority of the day; visible in the milieu. Patient is compliant with all medications and tolerating them well.  A: Support and encouragement provided to patient. Scheduled medications administered per MD orders. Maintain Q15 minute checks for safety.  R: Patient receptive. Denies SI/HI and AVH. Patient remains safe.

## 2014-05-11 NOTE — [facility] Group Notes (Signed)
[facility] LCSW Group Therapy  05/11/2014 11:14 AM  Type of Therapy:  Group Therapy  Participation Level:  Active  Participation Quality:  Attentive  Affect:  Appropriate  Cognitive:  Alert  Insight:  Improving  Engagement in Therapy:  Improving  Modes of Intervention:  Discussion, Education, Exploration, Limit-setting, Orientation, Rapport Building, Socialization and Support  Summary of Progress/Problems: Feelings around Diagnosis--patients were asked to talk about what diagnosis means to them, process if and why it is important to know their mental health diagnosis, and discuss pros and cons of having a mental health diagnosis. [name] was attentive during today's processing group. She did not actively participate in group discussion but remained alert throughout group. [name] continues to make limited progress in the group setting and demonstrates limited insight.   Smart, Arina Torry LCSWA 05/11/2014, 11:14 AM

## 2014-05-11 NOTE — Progress Notes (Signed)
Patient ID: [name], female   DOB: 1960/11/09, 53 y.o.   MRN: [id number] PER STATE REGULATIONS 482.30  THIS CHART WAS REVIEWED FOR MEDICAL NECESSITY WITH RESPECT TO THE PATIENT'S ADMISSION/ DURATION OF STAY.  NEXT REVIEW DATE: 05/15/2014  [name] Rashi Giuliani, RN, BSN CASE MANAGER

## 2014-05-11 NOTE — Progress Notes (Signed)
D: Pt denies SI/HI/AVH. Pt is pleasant and cooperative. Pt vomited later in the evening 2245, pt was given Ginger ale and pt appeared to be better. Pt plans to D/C to group home  A: Pt was offered support and encouragement. Pt was given scheduled medications. Pt was encourage to attend groups. Q 15 minute checks were done for safety.   R:Pt attends groups and interacts well with peers and staff. Pt is taking medication. Pt has no complaints at this time.Pt receptive to treatment and safety maintained on unit.

## 2014-05-11 NOTE — Progress Notes (Signed)
Patient ID: [name], female   DOB: 1960-09-19, 53 y.o.   MRN: [id number] [facility] Note  05/11/2014 3:04 PM [name]  MRN:  [id number] Subjective: Patient states "I'm doing ok".  Objective:  Patient seen and chart reviewed.She continues to be guarded. Patient appears to be improving with regards to taking care of her ADLs and per staff took a shower .Patient continues to deny any symptoms and is unable to elaborate . Patient is awaiting placement. Per csw will continue working on disposition. Patient reports sleep as fair and appetite as good Patient denies SI/HI/AH/VH. Patient is compliant on medications. Patient denies any side effects.    Diagnosis:   DSM5: Primary Psychiatric Diagnosis: Schizoaffective disorder,depressive type ,multiple episodes ,currently in acute episode  Non Psychiatric Diagnosis: GERD Hyperlipidemia  ADL's:  Intact  Sleep: Fair  Appetite:  Fair   Psychiatric Specialty Exam: Physical Exam  Review of Systems  Constitutional: Negative for fever, chills, weight loss, malaise/fatigue and diaphoresis.  HENT: Negative for congestion, ear discharge, ear pain, hearing loss, nosebleeds and tinnitus.   Eyes: Negative for blurred vision, double vision, photophobia, pain, discharge and redness.  Respiratory: Negative for cough, hemoptysis, sputum production, shortness of breath and stridor.   Cardiovascular: Negative for chest pain, palpitations, orthopnea, claudication and leg swelling.  Gastrointestinal: Negative for heartburn, nausea, vomiting, abdominal pain, diarrhea, constipation and blood in stool.  Genitourinary: Negative for dysuria, urgency, frequency and hematuria.  Musculoskeletal: Negative for back pain, joint pain, myalgias and neck pain.  Skin: Negative for itching and rash.  Neurological: Negative for dizziness, tingling, tremors, sensory change, speech change, focal weakness, seizures, weakness and headaches.  Endo/Heme/Allergies:  Negative for environmental allergies and polydipsia. Does not bruise/bleed easily.  Psychiatric/Behavioral: Positive for depression (improving). The patient is nervous/anxious.     Blood pressure 91/63, pulse 96, temperature 98.1 F (36.7 C), temperature source Oral, resp. rate 18, height 5' 1.75" (1.568 m), weight 81.194 kg (179 lb), last menstrual period 07/23/2004.Body mass index is 33.02 kg/(m^2).  General Appearance: Fairly Groomed  Patent attorneyye Contact::  Minimal  Speech:  Normal Rate  Volume:  Normal  Mood:  Anxious improving  Affect:  Depressed  Thought Process:  Linear  Orientation:  Full (Time, Place, and Person)  Thought Content:  WDL  Suicidal Thoughts:  No  Homicidal Thoughts:  No  Memory:  Immediate;   Fair Recent;   Fair Remote;   Fair  Judgement:  Fair  Insight:  Lacking  Psychomotor Activity:  Decreased  Concentration:  Fair  Recall:  [employer]Fair  Fund of Knowledge:Fair  Language: Good  Akathisia:  No  Handed:  Right  AIMS (if indicated):     Assets:  Communication Skills Desire for Improvement  Sleep:  Number of Hours: 6.5   Musculoskeletal: Strength & Muscle Tone: within normal limits Gait & Station: normal Patient leans: N/A  Current Medications: Current Facility-Administered Medications  Medication Dose Route Frequency Provider Last Rate Last Dose  . acetaminophen (TYLENOL) tablet 650 mg  650 mg Oral Q6H PRN Fransisca KaufmannLaura Davis, NP      . alum & mag hydroxide-simeth (MAALOX/MYLANTA) 200-200-20 MG/5ML suspension 30 mL  30 mL Oral Q4H PRN Fransisca KaufmannLaura Davis, NP      . benztropine (COGENTIN) tablet 0.5 mg  0.5 mg Oral BID Jomarie LongsSaramma Roselin Wiemann, MD   0.5 mg at 05/11/14 0820  . carbamazepine (TEGRETOL XR) 12 hr tablet 200 mg  200 mg Oral BID Fransisca KaufmannLaura Davis, NP   200 mg at 05/11/14 0820  .  citalopram (CELEXA) tablet 40 mg  40 mg Oral QAC breakfast Fransisca KaufmannLaura Davis, NP   40 mg at 05/11/14 [id number]  . gabapentin (NEURONTIN) capsule 200 mg  200 mg Oral TID Jomarie LongsSaramma Shoshana Johal, MD   200 mg at 05/11/14 1159  .  magnesium hydroxide (MILK OF MAGNESIA) suspension 30 mL  30 mL Oral Daily PRN Fransisca KaufmannLaura Davis, NP      . mirtazapine (REMERON) tablet 7.5 mg  7.5 mg Oral QHS Jomarie LongsSaramma Marke Goodwyn, MD   7.5 mg at 05/10/14 2120  . ondansetron (ZOFRAN-ODT) disintegrating tablet 4 mg  4 mg Oral Q8H PRN Kerry HoughSpencer E Simon, PA-C      . risperiDONE (RISPERDAL) tablet 3 mg  3 mg Oral BID Fransisca KaufmannLaura Davis, NP   3 mg at 05/11/14 0820  . simvastatin (ZOCOR) tablet 20 mg  20 mg Oral q1800 Jomarie LongsSaramma Ashely Goosby, MD   20 mg at 05/10/14 1708    Lab Results:  Results for orders placed during the hospital encounter of 05/04/14 (from the past 48 hour(s))  CARBAMAZEPINE LEVEL, TOTAL     Status: None   Collection Time    05/10/14  6:20 AM      Result Value Ref Range   Carbamazepine Lvl 4.8  4.0 - 12.0 ug/mL   Comment: [facility]    Physical Findings: AIMS: Facial and Oral Movements Muscles of Facial Expression: None, normal Lips and Perioral Area: None, normal Jaw: None, normal Tongue: None, normal,Extremity Movements Upper (arms, wrists, hands, fingers): None, normal Lower (legs, knees, ankles, toes): None, normal, Trunk Movements Neck, shoulders, hips: None, normal, Overall Severity Severity of abnormal movements (highest score from questions above): None, normal Incapacitation due to abnormal movements: None, normal Patient's awareness of abnormal movements (rate only patient's report): No Awareness, Dental Status Current problems with teeth and/or dentures?: No Does patient usually wear dentures?: No  CIWA:  CIWA-Ar Total: 3 COWS:  COWS Total Score: 3  Treatment Plan Summary: Daily contact with patient to assess and evaluate symptoms and progress in treatment Medication management  Plan: 1. Will continue Tegretol XR 200 mg po bid for mood lability. 2. Will continue Gabapentin 200 mg po bid for pain as well as anxiety. 3. Will continue Celexa 40 mg for affective symptoms. 4. Will continue Remeron 7.5 mg po qhs  for sleep. 5. Will continue Risperdal 3 mg po bid for psychosis. 6. Will continue Cogentin 0.5 mg po bid for eps. 7. Will continue Zocor 20 mg po daily for hyperlipidemia. 8. CSW will work on disposition.Patient to be referred to a structured group home placement.  9. Tegretol level on 05/10/14- 4.8.   Medical Decision Making Problem Points:  Established problem, stable/improving (1), Review of last therapy session (1) and Review of psycho-social stressors (1) Data Points:  Review or order clinical lab tests (1) Review of medication regiment & side effects (2)  I certify that inpatient services furnished can reasonably be expected to improve the patient's condition.   Naomi Fitton MD 05/11/2014 3:04 PM

## 2014-05-11 NOTE — Plan of Care (Signed)
Problem: Diagnosis: Increased Risk For Suicide Attempt Goal: LTG-Patient Will Report Improvement in Psychotic Symptoms LTG (by discharge) : Patient will report improvement in psychotic symptoms.  Outcome: Progressing Pt denies AVH  Problem: Alteration in mood Goal: LTG-Patient reports reduction in suicidal thoughts (Patient reports reduction in suicidal thoughts and is able to verbalize a safety plan for whenever patient is feeling suicidal)  Outcome: Progressing Pt denies SI

## 2014-05-11 NOTE — [facility] Group Notes (Signed)
The focus of this group is to educate the patient on the purpose and policies of crisis stabilization and provide a format to answer questions about their admission.  The group details unit policies and expectations of patients while admitted. Patient attended this group and was cooperative. 

## 2014-05-11 NOTE — Progress Notes (Signed)
Adult Psychoeducational Group Note  Date:  05/11/2014 Time:  10:13 PM  Group Topic/Focus:  Wrap-Up Group:   The focus of this group is to help patients review their daily goal of treatment and discuss progress on daily workbooks.  Participation Level:  Minimal  Participation Quality:  Minimal  Affect:  Flat  Cognitive:  Lacking  Insight: Limited  Engagement in Group:  Limited  Modes of Intervention:  Socialization and Support  Additional Comments:  Patient attended and participated in group tonight. She reports having a good day. She stated that today a lady from a group home. The interview went well. She spent most of the day in the day room socializing.  Lita MainsFrancis, Janai Maudlin [facility] 05/11/2014, 10:13 PM

## 2014-05-12 NOTE — Progress Notes (Signed)
Patient ID: Rebecca Fueloni Gropp, female   DOB: Feb 26, 1961, 53 y.o.   MRN: [id number] [facility]  05/12/2014 12:47 PM [name]  MRN:  [id number] Subjective: Patient states 'My mood is average'  Objective:  Patient seen and chart reviewed. Patient appears to be guarded ,that seems to be her baseline. She has been taking care of her ADL s more since admission and is improving. She continues to be guarded. Patient is awaiting placement. Per csw will continue working on disposition.Pt seen by ALF yesterday -pending placement. Patient reports sleep as fair and appetite as good Patient denies SI/HI/AH/VH. Patient is compliant on medications. Patient denies any side effects.    Diagnosis:   DSM5: Primary Psychiatric Diagnosis: Schizoaffective disorder,depressive type ,multiple episodes ,currently in acute episode  Non Psychiatric Diagnosis: GERD Hyperlipidemia  ADL's:  Intact  Sleep: Fair  Appetite:  Fair   Psychiatric Specialty Exam: Physical Exam  Review of Systems  Constitutional: Negative for fever, chills, weight loss, malaise/fatigue and diaphoresis.  HENT: Negative for congestion, ear discharge, ear pain, hearing loss, nosebleeds and tinnitus.   Eyes: Negative for blurred vision, double vision, photophobia, pain, discharge and redness.  Respiratory: Negative for cough, hemoptysis, sputum production, shortness of breath and stridor.   Cardiovascular: Negative for chest pain, palpitations, orthopnea, claudication and leg swelling.  Gastrointestinal: Negative for heartburn, nausea, vomiting, abdominal pain, diarrhea, constipation and blood in stool.  Genitourinary: Negative for dysuria, urgency, frequency and hematuria.  Musculoskeletal: Negative for back pain, joint pain, myalgias and neck pain.  Skin: Negative for itching and rash.  Neurological: Negative for dizziness, tingling, tremors, sensory change, speech change, focal weakness, seizures, weakness and headaches.   Endo/Heme/Allergies: Negative for environmental allergies and polydipsia. Does not bruise/bleed easily.  Psychiatric/Behavioral: Positive for depression (improving). The patient is nervous/anxious.     Blood pressure 89/64, pulse 68, temperature 97.3 F (36.3 C), temperature source Oral, resp. rate 18, height 5' 1.75" (1.568 m), weight 81.194 kg (179 lb), last menstrual period 07/23/2004.Body mass index is 33.02 kg/(m^2).  General Appearance: Fairly Groomed  Patent attorneyye Contact::  Minimal  Speech:  Normal Rate  Volume:  Normal  Mood:  Anxious improving  Affect:  Depressed  Thought Process:  Linear  Orientation:  Full (Time, Place, and Person)  Thought Content:  WDL  Suicidal Thoughts:  No  Homicidal Thoughts:  No  Memory:  Immediate;   Fair Recent;   Fair Remote;   Fair  Judgement:  Fair  Insight:  Lacking  Psychomotor Activity:  Decreased  Concentration:  Fair  Recall:  [employer]Fair  Fund of Knowledge:Fair  Language: Good  Akathisia:  No  Handed:  Right  AIMS (if indicated):     Assets:  Communication Skills Desire for Improvement  Sleep:  Number of Hours: 6.5   Musculoskeletal: Strength & Muscle Tone: within normal limits Gait & Station: normal Patient leans: N/A  Current Medications: Current Facility-Administered Medications  Medication Dose Route Frequency Provider Last Rate Last Dose  . acetaminophen (TYLENOL) tablet 650 mg  650 mg Oral Q6H PRN Fransisca KaufmannLaura Davis, NP      . alum & mag hydroxide-simeth (MAALOX/MYLANTA) 200-200-20 MG/5ML suspension 30 mL  30 mL Oral Q4H PRN Fransisca KaufmannLaura Davis, NP      . benztropine (COGENTIN) tablet 0.5 mg  0.5 mg Oral BID Jomarie LongsSaramma Giannamarie Paulus, MD   0.5 mg at 05/12/14 0744  . carbamazepine (TEGRETOL XR) 12 hr tablet 200 mg  200 mg Oral BID Fransisca KaufmannLaura Davis, NP   200 mg at  05/12/14 0744  . citalopram (CELEXA) tablet 40 mg  40 mg Oral QAC breakfast Fransisca KaufmannLaura Davis, NP   40 mg at 05/12/14 [id number]  . gabapentin (NEURONTIN) capsule 200 mg  200 mg Oral TID Jomarie LongsSaramma Herby Amick, MD   200 mg  at 05/12/14 1206  . magnesium hydroxide (MILK OF MAGNESIA) suspension 30 mL  30 mL Oral Daily PRN Fransisca KaufmannLaura Davis, NP      . mirtazapine (REMERON) tablet 7.5 mg  7.5 mg Oral QHS Jomarie LongsSaramma Alfonsa Vaile, MD   7.5 mg at 05/11/14 2226  . ondansetron (ZOFRAN-ODT) disintegrating tablet 4 mg  4 mg Oral Q8H PRN Kerry HoughSpencer E Simon, PA-C      . risperiDONE (RISPERDAL) tablet 3 mg  3 mg Oral BID Fransisca KaufmannLaura Davis, NP   3 mg at 05/12/14 0744  . simvastatin (ZOCOR) tablet 20 mg  20 mg Oral q1800 Jomarie LongsSaramma Stuart Mirabile, MD   20 mg at 05/11/14 1713    Lab Results:  No results found for this or any previous visit (from the past 48 hour(s)).  Physical Findings: AIMS: Facial and Oral Movements Muscles of Facial Expression: None, normal Lips and Perioral Area: None, normal Jaw: None, normal Tongue: None, normal,Extremity Movements Upper (arms, wrists, hands, fingers): None, normal Lower (legs, knees, ankles, toes): None, normal, Trunk Movements Neck, shoulders, hips: None, normal, Overall Severity Severity of abnormal movements (highest score from questions above): None, normal Incapacitation due to abnormal movements: None, normal Patient's awareness of abnormal movements (rate only patient's report): No Awareness, Dental Status Current problems with teeth and/or dentures?: No Does patient usually wear dentures?: No  CIWA:  CIWA-Ar Total: 3 COWS:  COWS Total Score: 3  Treatment Plan Summary: Daily contact with patient to assess and evaluate symptoms and progress in treatment Medication management  Plan: 1. Will continue Tegretol XR 200 mg po bid for mood lability. 2. Will continue Gabapentin 200 mg po bid for pain as well as anxiety. 3. Will continue Celexa 40 mg for affective symptoms. 4. Will continue Remeron 7.5 mg po qhs for sleep. 5. Will continue Risperdal 3 mg po bid for psychosis. 6. Will continue Cogentin 0.5 mg po bid for eps. 7. Will continue Zocor 20 mg po daily for hyperlipidemia. 8. CSW will work on  disposition.Patient to be placed at ALF. 9. Tegretol level on 05/10/14- 4.8.   Medical Decision Making Problem Points:  Established problem, stable/improving (1), Review of last therapy session (1) and Review of psycho-social stressors (1) Data Points:  Review or order clinical lab tests (1) Review of medication regiment & side effects (2)  I certify that inpatient services furnished can reasonably be expected to improve the patient's condition.   Gustava Berland MD 05/12/2014 12:47 PM

## 2014-05-12 NOTE — Progress Notes (Signed)
Patient ID: [name], female   DOB: 27-Dec-1960, 53 y.o.   MRN: [id number]  D: Writer asked the pt about her day. Pt stated, "Ok". When asked the disposition of her group home assignment pt stated "her and her husband still talking about whether they're gonna let me come or not".  Pt states she wants to go to the group home.  A:  Support and encouragement was offered. 15 min checks continued for safety.  R: Pt remains safe.

## 2014-05-12 NOTE — [facility] Group Notes (Signed)
Adult Psychoeducational Group Note  Date:  05/12/2014 Time:  9:32 PM  Group Topic/Focus:  Wrap-Up Group:   The focus of this group is to help patients review their daily goal of treatment and discuss progress on daily workbooks.  Participation Level:  Minimal  Participation Quality:  Attentive  Affect:  Flat  Cognitive:  Alert  Insight: Limited  Engagement in Group:  Limited  Modes of Intervention:  Discussion  Additional Comments:  [name] said her day was ok.  She didn't have any visitors and did not set Duncan goal for today.  She also stated [facility]  Her coping skills are deep breathing and journaling.  [name], [name] 05/12/2014, 9:32 PM

## 2014-05-12 NOTE — Progress Notes (Signed)
Patient ID: [name], female   DOB: 06-02-1961, 53 y.o.   MRN: [id number]  D: Pt was pleasant and cooperative but appeared to be slightly anxious during the assessment process. Pt denied having any questions or concerns. Pt answered "No" or "un huh".  When asked about conversations with the dr, pt replied, "ok".   A:  Support and encouragement was offered. 15 min checks continued for safety.  R: Pt remains safe.

## 2014-05-12 NOTE — Progress Notes (Signed)
D: Patient's affect appropriate to circumstance and mood is depressed. She reported on the self inventory sheet that sleep is fair, good appetite and ability to concentrate and normal energy level. Patient rates depression, feelings of hopelessness and anxiety "1". Writer observed that the patient has been in the dayroom attending group sessions and outside of this time, she's watching television, but no interaction with others. Continues to adhere to current medication regimen.  A: Support and encouragement provided to patient. Administered scheduled medications per ordering MD. Monitor Q15 minute checks for safety.  R: Patient receptive. Denies SI/HI/AVH. Patient remains safe on the hall.

## 2014-05-12 NOTE — [facility] Group Notes (Signed)
[facility] LCSW Group Therapy  05/12/2014 1:21 PM  Type of Therapy:  Group Therapy  Participation Level:  None  Participation Quality:  Attentive  Affect:  Depressed and Flat  Cognitive:  Appropriate  Insight:  Limited  Engagement in Therapy:  Limited  Modes of Intervention:  Discussion, Education, Socialization and Support  Summary of Progress/Problems:[employer] ([employer]) speaker came to talk about his personal journey with substance abuse and mental illness. Group members were challenged to process ways by which to relate to the speaker. MHA speaker provided handouts and educational information pertaining to groups and services offered by the [facility]. [name] attended group and stayed the entire time. She sat quietly but was attentive.   Hyatt,Candace 05/12/2014, 1:21 PM

## 2014-05-13 NOTE — Progress Notes (Signed)
D: Patient is alert and oriented. Pts mood is "ok" and affect is blunted. Pt denies SI/HI and AVH. Pt reports her depression, hopelessness, and anxiety 1/10 today. A: Medication administered per providers orders (See MAR). 15 minute checks completed per protocol for pt safety. R: Pt cooperative and receptive to nursing interventions.

## 2014-05-13 NOTE — Plan of Care (Signed)
Problem: Diagnosis: Increased Risk For Suicide Attempt Goal: STG-Patient Will Attend All Groups On The Unit Outcome: Progressing Pt attended evening group on 05/13/2014 and reports she attended all groups throughout the day.

## 2014-05-13 NOTE — Progress Notes (Signed)
Pt stated that she had an ok day and attended all groups. Pt also stated that she will be discharged tomorrow and she is going to work really hard to keep the bad thoughts out of her mind.

## 2014-05-13 NOTE — Plan of Care (Signed)
Problem: Ineffective individual coping Goal: STG: Patient will remain free from self harm Outcome: Progressing Pt denies SI. 15 minute checks completed per protocol for patient safety.  Problem: Alteration in mood Goal: LTG-Patient reports reduction in suicidal thoughts (Patient reports reduction in suicidal thoughts and is able to verbalize a safety plan for whenever patient is feeling suicidal)  Outcome: Completed/Met Date Met:  05/13/14 Pt denies SI.

## 2014-05-13 NOTE — [facility] Group Notes (Signed)
The focus of this group is to educate the patient on the purpose and policies of crisis stabilization and provide a format to answer questions about their admission.  The group details unit policies and expectations of patients while admitted. Patient attended this group. 

## 2014-05-13 NOTE — Progress Notes (Signed)
D: Pt has appropriate affect and depressed mood.  Pt denies SI/HI, denies hallucinations.  Pt reports her goal for the day was "to attend all the groups" and that she achieved this goal.  She interacts appropriately with staff.  Minimal interactions with peers.  Pt attended evening group and went to bed afterwards.   A: Met with pt 1:1 and offered support and encouragement.  Medications administered per order.  Safety maintained. R: Pt is in no distress and verbally contracts for safety.  Pt denies needs and concerns.  Will continue to monitor and assess for safety.

## 2014-05-13 NOTE — Progress Notes (Signed)
Patient ID: [name], female   DOB: January 18, 1961, 53 y.o.   MRN: [id number] [facility] MD Progress Note  05/13/2014 1:51 PM Rebecca Fueloni Copelin  MRN:  [id number] Subjective: Patient states ' I am OK".  Objective:  Patient seen and chart reviewed. Patient appears to be guarded , but is at baseline. She has been taking care of her ADL s more since admission and is improving.  Patient is awaiting placement. Per csw will continue working on disposition.Pt seen by ALF yesterday -pending placement. Patient reports sleep as fair and appetite as good Patient denies SI/HI/AH/VH. Patient is compliant on medications. Patient denies any side effects.    Diagnosis:   DSM5: Primary Psychiatric Diagnosis: Schizoaffective disorder,depressive type ,multiple episodes ,currently in acute episode  Non Psychiatric Diagnosis: GERD Hyperlipidemia  ADL's:  Intact  Sleep: Fair  Appetite:  Fair   Psychiatric Specialty Exam: Physical Exam  Review of Systems  Constitutional: Negative for fever, chills, weight loss, malaise/fatigue and diaphoresis.  HENT: Negative for congestion, ear discharge, ear pain, hearing loss, nosebleeds and tinnitus.   Eyes: Negative for blurred vision, double vision, photophobia, pain, discharge and redness.  Respiratory: Negative for cough, hemoptysis, sputum production, shortness of breath and stridor.   Cardiovascular: Negative for chest pain, palpitations, orthopnea, claudication and leg swelling.  Gastrointestinal: Negative for heartburn, nausea, vomiting, abdominal pain, diarrhea, constipation and blood in stool.  Genitourinary: Negative for dysuria, urgency, frequency and hematuria.  Musculoskeletal: Negative for back pain, joint pain, myalgias and neck pain.  Skin: Negative for itching and rash.  Neurological: Negative for dizziness, tingling, tremors, sensory change, speech change, focal weakness, seizures, weakness and headaches.  Endo/Heme/Allergies: Negative for environmental  allergies and polydipsia. Does not bruise/bleed easily.  Psychiatric/Behavioral: Positive for depression (improving). The patient is nervous/anxious.     Blood pressure 95/71, pulse 84, temperature 98.5 F (36.9 C), temperature source Oral, resp. rate 16, height 5' 1.75" (1.568 m), weight 81.194 kg (179 lb), last menstrual period 07/23/2004.Body mass index is 33.02 kg/(m^2).  General Appearance: Fairly Groomed  Patent attorneyye Contact::  Fair  Speech:  Normal Rate  Volume:  Normal  Mood:  Anxious improving  Affect:  Congruent  Thought Process:  Linear  Orientation:  Full (Time, Place, and Person)  Thought Content:  WDL  Suicidal Thoughts:  No  Homicidal Thoughts:  No  Memory:  Immediate;   Fair Recent;   Fair Remote;   Fair  Judgement:  Fair  Insight:  Lacking  Psychomotor Activity:  Decreased  Concentration:  Fair  Recall:  [employer]  Fund of Knowledge:Fair  Language: Good  Akathisia:  No  Handed:  Right  AIMS (if indicated):     Assets:  Communication Skills Desire for Improvement  Sleep:  Number of Hours: 6.5   Musculoskeletal: Strength & Muscle Tone: within normal limits Gait & Station: normal Patient leans: N/A  Current Medications: Current Facility-Administered Medications  Medication Dose Route Frequency Provider Last Rate Last Dose  . acetaminophen (TYLENOL) tablet 650 mg  650 mg Oral Q6H PRN Fransisca KaufmannLaura Davis, NP      . alum & mag hydroxide-simeth (MAALOX/MYLANTA) 200-200-20 MG/5ML suspension 30 mL  30 mL Oral Q4H PRN Fransisca KaufmannLaura Davis, NP      . benztropine (COGENTIN) tablet 0.5 mg  0.5 mg Oral BID Jomarie LongsSaramma Sherby Moncayo, MD   0.5 mg at 05/13/14 0755  . carbamazepine (TEGRETOL XR) 12 hr tablet 200 mg  200 mg Oral BID Fransisca KaufmannLaura Davis, NP   200 mg at 05/13/14 0755  . citalopram (  CELEXA) tablet 40 mg  40 mg Oral QAC breakfast Fransisca KaufmannLaura Davis, NP   40 mg at 05/13/14 0630  . gabapentin (NEURONTIN) capsule 200 mg  200 mg Oral TID Jomarie LongsSaramma Even Budlong, MD   200 mg at 05/13/14 1200  . magnesium hydroxide (MILK OF  MAGNESIA) suspension 30 mL  30 mL Oral Daily PRN Fransisca KaufmannLaura Davis, NP      . mirtazapine (REMERON) tablet 7.5 mg  7.5 mg Oral QHS Jomarie LongsSaramma Emberly Tomasso, MD   7.5 mg at 05/12/14 2151  . ondansetron (ZOFRAN-ODT) disintegrating tablet 4 mg  4 mg Oral Q8H PRN Kerry HoughSpencer E Simon, PA-C      . risperiDONE (RISPERDAL) tablet 3 mg  3 mg Oral BID Fransisca KaufmannLaura Davis, NP   3 mg at 05/13/14 0755  . simvastatin (ZOCOR) tablet 20 mg  20 mg Oral q1800 Jomarie LongsSaramma Zyra Parrillo, MD   20 mg at 05/12/14 1701    Lab Results:  No results found for this or any previous visit (from the past 48 hour(s)).  Physical Findings: AIMS: Facial and Oral Movements Muscles of Facial Expression: None, normal Lips and Perioral Area: None, normal Jaw: None, normal Tongue: None, normal,Extremity Movements Upper (arms, wrists, hands, fingers): None, normal Lower (legs, knees, ankles, toes): None, normal, Trunk Movements Neck, shoulders, hips: None, normal, Overall Severity Severity of abnormal movements (highest score from questions above): None, normal Incapacitation due to abnormal movements: None, normal Patient's awareness of abnormal movements (rate only patient's report): No Awareness, Dental Status Current problems with teeth and/or dentures?: No Does patient usually wear dentures?: No  CIWA:  CIWA-Ar Total: 3 COWS:  COWS Total Score: 3  Treatment Plan Summary: Daily contact with patient to assess and evaluate symptoms and progress in treatment Medication management  Plan: 1. Will continue Tegretol XR 200 mg po bid for mood lability. 2. Will continue Gabapentin 200 mg po bid for pain as well as anxiety. 3. Will continue Celexa 40 mg for affective symptoms. 4. Will continue Remeron 7.5 mg po qhs for sleep. 5. Will continue Risperdal 3 mg po bid for psychosis. 6. Will continue Cogentin 0.5 mg po bid for eps. 7. Will continue Zocor 20 mg po daily for hyperlipidemia. 8. CSW will work on disposition.Patient to be placed at ALF. 9. Tegretol level  on 05/10/14- 4.8.   Medical Decision Making Problem Points:  Established problem, stable/improving (1), Review of last therapy session (1) and Review of psycho-social stressors (1) Data Points:  Review or order clinical lab tests (1) Review of medication regiment & side effects (2)  I certify that inpatient services furnished can reasonably be expected to improve the patient's condition.   Chosen Garron MD 05/13/2014 1:51 PM

## 2014-05-14 MED ORDER — RISPERIDONE 3 MG PO TABS: 3.0000 mg | ORAL_TABLET | Freq: Two times a day (BID) | ORAL | Status: DC

## 2014-05-14 MED ORDER — MIRTAZAPINE 7.5 MG PO TABS: 7.5000 mg | ORAL_TABLET | Freq: Every day | ORAL | Status: DC

## 2014-05-14 MED ORDER — CITALOPRAM HYDROBROMIDE 40 MG PO TABS
40.0000 mg | ORAL_TABLET | Freq: Every day | ORAL | Status: DC
Start: 2014-05-14 — End: 2014-05-25

## 2014-05-14 MED ORDER — GABAPENTIN 100 MG PO CAPS: 200.0000 mg | ORAL_CAPSULE | Freq: Three times a day (TID) | ORAL | Status: DC

## 2014-05-14 MED ORDER — BENZTROPINE MESYLATE 0.5 MG PO TABS: 0.5000 mg | ORAL_TABLET | Freq: Two times a day (BID) | ORAL | Status: DC

## 2014-05-14 MED ORDER — CARBAMAZEPINE ER 200 MG PO TB12: 200.0000 mg | ORAL_TABLET | Freq: Two times a day (BID) | ORAL | Status: DC

## 2014-05-14 NOTE — Discharge Summary (Signed)
Physician Discharge Summary Note  Patient:  [name] is an 53 y.o., female MRN:  [id number] DOB:  03-10-1961 Patient phone:  [phone] (home)  Patient address:   [address] [place] [address]   Date of Admission:  05/04/2014 Date of Discharge: 05/14/2014   Discharge Diagnosis: DSM 5  Primary Psychiatric Diagnosis:  Schizoaffective disorder,depressive type ,multiple episodes ,currently in acute episode (resolved)   Non Psychiatric Diagnosis:  GERD  Hyperlipidemia      Past Medical History  Diagnosis Date  . Gastro - esophageal reflux disease   . Schizoaffective disorder   . Bipolar mood disorder   . Bipolar affective     complient with meds  . Depression   . Anxiety         General Appearance: Casual   Eye Contact:: Fair   Speech: Normal Rate   Volume: Normal   Mood: Euthymic   Affect: Flat   Thought Process: Coherent   Orientation: Full (Time, Place, and Person)   Thought Content: WDL   Suicidal Thoughts: No   Homicidal Thoughts: No   Memory: Immediate; Fair  Recent; Fair  Remote; Fair   Judgement: Fair   Insight: Fair   Psychomotor Activity: Normal   Concentration: Fair   Recall: [employer]Fair   Fund of Knowledge:Fair   Language: Good   Akathisia: No   Handed: Right   AIMS (if indicated):   Assets: Communication Skills  Social Support   Sleep: Number of Hours: 6.75    Musculoskeletal:  Strength & Muscle Tone: within normal limits  Gait & Station: normal  Patient leans: N/A   Level of Care:  OP  Hospital Course:   [name] is a 53 year old female who [facility] with complaints of increased depression and suicidal thoughts. She reported suicidal thoughts with a plan to cut her wrist with a butcher knife. Patient reports her main trigger for depression is being estranged from her 44twenty 14six year old son. He is refusing to talk her. Patient states "I was having suicidal thoughts. I've been real depressed. I was thinking  of cutting my wrist. My son is mad at me. We have not communicated for the last three years. I think he is mad at me because I got divorced from his father. I am also not in contact with my siblings. I guess I have burned out with my other family too. I called my ACT team because I was suicidal. I have acted on those thoughts before. My depression is a ten today along with my anxiety. I have been taking my medications. I have been taking my medications regularly. I have not been hearing voices or feeling manic lately. Just very depressed. I am not sure why I am so depressed other than my son. I was doing well for a while." Sheralyn Boatmanoni appears severely depressed throughout the assessment. No observations of acute psychosis or manic symptoms during the assessment. She denies current intent to hurt herself. Patient feels that she would benefit from a change to her antidepressant.   During Hospitalization: Medications managed, psychoeducation, group and individual therapy.Collateral information obtained showed that pt was at an ALF but was doing well ,so was transitioned to an apartment for independent living . But pt thereafter decompensated and was not able to take care of her ADLs .During her stay here several referrals were made for placement but were unsuccessful . Pt was doing well with regards to her mood sx as well as  sleep and started taking care of her ADL s and hence it was discussed with her ACTT that they continue to work on her placement while she awaits at her apartment. Pt was agreeable as well as her ACTT. At discharge, pt rated anxiety and depression as minimal. Pt will followup with outpatient treatment; picked up by ACT Team. Affirms agreement with medication regimen and discharge plan. Denies other physical and psychological concerns at time of discharge.       Follow-up Information   Follow up with [facility] . (Your ACT team will pick you up when you are discharged. )     Contact information:   [address] [place] [address] Phone: [phone] Fax: [phone]    .  Consults:  None  Significant Diagnostic Studies:  See electronic record for details  Discharge Vitals:   Blood pressure 103/62, pulse 90, temperature 97.6 F (36.4 C), temperature source Oral, resp. rate 16, height 5' 1.75" (1.568 m), weight 81.194 kg (179 lb), last menstrual period 07/23/2004..  Mental Status Exam: See Mental Status Examination and Suicide Risk Assessment completed by Attending Physician prior to discharge.  Discharge destination:  Home  Is patient on multiple antipsychotic therapies at discharge:  No  Has Patient had three or more failed trials of antipsychotic monotherapy by history: N/A  Recommended Plan for Multiple Antipsychotic Therapies: N/A     Medication List    STOP taking these medications       busPIRone 10 MG tablet  Commonly known as:  BUSPAR      TAKE these medications     Indication   benztropine 0.5 MG tablet  Commonly known as:  COGENTIN  Take 1 tablet (0.5 mg total) by mouth 2 (two) times daily. For medication induced involuntary movements   Indication:  Extrapyramidal Reaction caused by Medications     carbamazepine 200 MG 12 hr tablet  Commonly known as:  TEGRETOL XR  Take 1 tablet (200 mg total) by mouth 2 (two) times daily. For mood stabilization   Indication:  Manic-Depression, Mood Stabilization     citalopram 40 MG tablet  Commonly known as:  CELEXA  Take 1 tablet (40 mg total) by mouth daily before breakfast. For depression   Indication:  Depression     gabapentin 100 MG capsule  Commonly known as:  NEURONTIN  Take 2 capsules (200 mg total) by mouth 3 (three) times daily. For pain   Indication:  Pain     mirtazapine 7.5 MG tablet  Commonly known as:  REMERON  Take 1 tablet (7.5 mg total) by mouth at bedtime. For insomnia and depression   Indication:  Trouble Sleeping, Major Depressive Disorder      risperiDONE 3 MG tablet  Commonly known as:  RISPERDAL  Take 1 tablet (3 mg total) by mouth 2 (two) times daily. For mood control   Indication:  Mood control       Follow-up Information   Follow up with [facility] . (Your ACT team will pick you up when you are discharged. )    Contact information:   [address] [place], [place] [address] Phone: [phone] Fax: [phone]     Follow-up recommendations:   Activities: Resume typical activities Diet: Resume typical diet Tests: none Other: Follow up with outpatient provider and report any side effects to out patient prescriber.  Comments:   Take all medications as prescribed. Keep all follow-up appointments as scheduled.  Do  not consume alcohol or use illegal drugs while on prescription medications. Report any adverse effects from your medications to your primary care provider promptly.  In the event of recurrent symptoms or worsening symptoms, call 911, a crisis hotline, or go to the nearest emergency department for evaluation.   Signed: Beau Fanny, FNP-BC 05/14/2014 3:16 PM  Patient was seen face to face for psychiatric evaluation, suicide risk assessment and case discussed with treatment team and NP and made appropriate disposition plans. Reviewed the information documented and agree with the treatment plan.    Jomarie Longs ,MD Attending Psychiatrist  [facility]

## 2014-05-14 NOTE — [facility] Suicide Risk Assessment (Signed)
   Demographic Factors:  Divorced or widowed and Caucasian  Total Time spent with patient: 45 minutes  Psychiatric Specialty Exam: Physical Exam  Constitutional: She is oriented to person, place, and time. She appears well-developed and well-nourished.  HENT:  Head: Normocephalic.  Eyes: EOM are normal.  Neck: Normal range of motion.  Respiratory: Effort normal.  GI: Soft.  Musculoskeletal: Normal range of motion.  Neurological: She is alert and oriented to person, place, and time.  Skin: Skin is warm.  Psychiatric: She has a normal mood and affect. Her behavior is normal. Judgment and thought content normal.    Review of Systems  Constitutional: Negative.   HENT: Negative.   Eyes: Negative.   Respiratory: Negative.   Cardiovascular: Negative.   Gastrointestinal: Negative.   Genitourinary: Negative.   Musculoskeletal: Negative.   Skin: Negative.   Neurological: Negative.   Psychiatric/Behavioral: Negative for depression, suicidal ideas and hallucinations.    Blood pressure 103/62, pulse 90, temperature 97.6 F (36.4 C), temperature source Oral, resp. rate 16, height 5' 1.75" (1.568 m), weight 81.194 kg (179 lb), last menstrual period 07/23/2004.Body mass index is 33.02 kg/(m^2).  General Appearance: Casual  Eye Contact::  Fair  Speech:  Normal Rate  Volume:  Normal  Mood:  Euthymic  Affect:  Flat  Thought Process:  Coherent  Orientation:  Full (Time, Place, and Person)  Thought Content:  WDL  Suicidal Thoughts:  No  Homicidal Thoughts:  No  Memory:  Immediate;   Fair Recent;   Fair Remote;   Fair  Judgement:  Fair  Insight:  Fair  Psychomotor Activity:  Normal  Concentration:  Fair  Recall:  [employer]Fair  Fund of Knowledge:Fair  Language: Good  Akathisia:  No  Handed:  Right  AIMS (if indicated):     Assets:  Communication Skills Social Support  Sleep:  Number of Hours: 6.75    Musculoskeletal: Strength & Muscle Tone: within normal limits Gait & Station:  normal Patient leans: N/A   Mental Status Per Nursing Assessment::   On Admission:     Current Mental Status by Physician: denies SI/HI/AH/VH  Loss Factors: Decrease in vocational status and Decline in physical health  Historical Factors: Impulsivity  Risk Reduction Factors:   Positive social support and Positive therapeutic relationship  Continued Clinical Symptoms:  Previous Psychiatric Diagnoses and Treatments Medical Diagnoses and Treatments/Surgeries  Cognitive Features That Contribute To Risk:  Thought constriction (tunnel vision)    Suicide Risk:  Minimal: No identifiable suicidal ideation.  Patients presenting with no risk factors but with morbid ruminations; may be classified as minimal risk based on the severity of the depressive symptoms  Discharge Diagnoses:  DSM5:    Primary Psychiatric Diagnosis:  Schizoaffective disorder,depressive type ,multiple episodes ,currently in acute episode (resolved)   Non Psychiatric Diagnosis:  GERD  Hyperlipidemia   Past Medical History  Diagnosis Date  . Gastro - esophageal reflux disease   . Schizoaffective disorder   . Bipolar mood disorder   . Bipolar affective     complient with meds  . Depression   . Anxiety     Plan Of Care/Follow-up recommendations:  Activity:  No restrictions -see DC Instructions  Is patient on multiple antipsychotic therapies at discharge:  No   Has Patient had three or more failed trials of antipsychotic monotherapy by history:  No  Recommended Plan for Multiple Antipsychotic Therapies: NA    Carolee Channell MD 05/14/2014, 8:42 AM

## 2014-05-14 NOTE — Progress Notes (Signed)
Pt is for discharge today. She appears blunted but does contract for safety and denies SI and HI.Pt stated her depression is a 1/10 today and her anxiety a 1/10 today. She is ready for discharge and will leave between 11a-11:30a today. Pt has been attending groups this am and has been in the hallway when not in groups.

## 2014-05-16 ENCOUNTER — Emergency Department (HOSPITAL_COMMUNITY)
Admission: EM | Admit: 2014-05-16 | Discharge: 2014-05-18 | Disposition: A | Discharge status: Other Acute Inpatient Hospital | Payer: Medicare Other | Source: Home / Self Care | Attending: Emergency Medicine | Admitting: Emergency Medicine

## 2014-05-16 ENCOUNTER — Encounter (HOSPITAL_COMMUNITY): Payer: Self-pay | Admitting: Emergency Medicine

## 2014-05-16 DIAGNOSIS — R45851 Suicidal ideations: Secondary | ICD-10-CM

## 2014-05-16 DIAGNOSIS — Z720 Tobacco use: Secondary | ICD-10-CM | POA: Insufficient documentation

## 2014-05-16 DIAGNOSIS — Z79899 Other long term (current) drug therapy: Secondary | ICD-10-CM

## 2014-05-16 DIAGNOSIS — F259 Schizoaffective disorder, unspecified: Secondary | ICD-10-CM | POA: Insufficient documentation

## 2014-05-16 DIAGNOSIS — F319 Bipolar disorder, unspecified: Secondary | ICD-10-CM

## 2014-05-16 DIAGNOSIS — Z880 Allergy status to penicillin: Secondary | ICD-10-CM | POA: Insufficient documentation

## 2014-05-16 DIAGNOSIS — Z8719 Personal history of other diseases of the digestive system: Secondary | ICD-10-CM | POA: Insufficient documentation

## 2014-05-16 DIAGNOSIS — F419 Anxiety disorder, unspecified: Secondary | ICD-10-CM

## 2014-05-16 DIAGNOSIS — F251 Schizoaffective disorder, depressive type: Secondary | ICD-10-CM | POA: Diagnosis not present

## 2014-05-16 LAB — COMPREHENSIVE METABOLIC PANEL
ALT: 28 U/L (ref 0–35)
AST: 22 U/L (ref 0–37)
Albumin: 3.4 g/dL — ABNORMAL LOW (ref 3.5–5.2)
Alkaline Phosphatase: 86 U/L (ref 39–117)
Anion gap: 12 (ref 5–15)
BUN: 12 mg/dL (ref 6–23)
CO2: 28 meq/L (ref 19–32)
CREATININE: 1.1 mg/dL (ref 0.50–1.10)
Calcium: 9.2 mg/dL (ref 8.4–10.5)
Chloride: 97 mEq/L (ref 96–112)
GFR calc Af Amer: 64 mL/min — ABNORMAL LOW (ref >90)
GFR, EST NON AFRICAN AMERICAN: 55 mL/min — AB (ref >90)
Glucose, Bld: 107 mg/dL — ABNORMAL HIGH (ref 70–99)
POTASSIUM: 4.2 meq/L (ref 3.7–5.3)
Sodium: 137 mEq/L (ref 137–147)
Total Protein: 7.4 g/dL (ref 6.0–8.3)

## 2014-05-16 LAB — RAPID URINE DRUG SCREEN, HOSP PERFORMED
AMPHETAMINES: NOT DETECTED
BARBITURATES: NOT DETECTED
Benzodiazepines: NOT DETECTED
Cocaine: NOT DETECTED
Opiates: NOT DETECTED
Tetrahydrocannabinol: NOT DETECTED

## 2014-05-16 LAB — CBC
HEMATOCRIT: 45.8 % (ref 36.0–46.0)
Hemoglobin: 16.4 g/dL — ABNORMAL HIGH (ref 12.0–15.0)
MCH: 32.6 pg (ref 26.0–34.0)
MCHC: 35.8 g/dL (ref 30.0–36.0)
MCV: 91.1 fL (ref 78.0–100.0)
Platelets: 249 10*3/uL (ref 150–400)
RBC: 5.03 MIL/uL (ref 3.87–5.11)
RDW: 13.4 % (ref 11.5–15.5)
WBC: 7.4 10*3/uL (ref 4.0–10.5)

## 2014-05-16 LAB — SALICYLATE LEVEL: Salicylate Lvl: <2 mg/dL — ABNORMAL LOW (ref 2.8–20.0)

## 2014-05-16 LAB — ACETAMINOPHEN LEVEL

## 2014-05-16 LAB — ETHANOL

## 2014-05-16 MED ORDER — NICOTINE 21 MG/24HR TD PT24
21.0000 mg | MEDICATED_PATCH | Freq: Every day | TRANSDERMAL | Status: DC
  Filled 2014-05-16: qty 1

## 2014-05-16 MED ORDER — GABAPENTIN 100 MG PO CAPS
200.0000 mg | ORAL_CAPSULE | Freq: Three times a day (TID) | ORAL | Status: DC
  Administered 2014-05-17 – 2014-05-18 (×4): 200 mg via ORAL
  Filled 2014-05-16 (×7): qty 2

## 2014-05-16 MED ORDER — ALUM & MAG HYDROXIDE-SIMETH 200-200-20 MG/5ML PO SUSP: 30.0000 mL | ORAL | Status: DC | PRN

## 2014-05-16 MED ORDER — CITALOPRAM HYDROBROMIDE 10 MG PO TABS
40.0000 mg | ORAL_TABLET | Freq: Every day | ORAL | Status: DC
  Administered 2014-05-17 – 2014-05-18 (×2): 40 mg via ORAL
  Filled 2014-05-16 (×2): qty 4

## 2014-05-16 MED ORDER — MIRTAZAPINE 7.5 MG PO TABS
7.5000 mg | ORAL_TABLET | Freq: Every day | ORAL | Status: DC
  Administered 2014-05-16 – 2014-05-17 (×2): 7.5 mg via ORAL
  Filled 2014-05-16 (×3): qty 1

## 2014-05-16 MED ORDER — BENZTROPINE MESYLATE 1 MG PO TABS
0.5000 mg | ORAL_TABLET | Freq: Two times a day (BID) | ORAL | Status: DC
  Administered 2014-05-16 – 2014-05-18 (×4): 0.5 mg via ORAL
  Filled 2014-05-16 (×4): qty 1

## 2014-05-16 MED ORDER — IBUPROFEN 400 MG PO TABS: 600.0000 mg | ORAL_TABLET | Freq: Three times a day (TID) | ORAL | Status: DC | PRN

## 2014-05-16 MED ORDER — RISPERIDONE 0.5 MG PO TABS
3.0000 mg | ORAL_TABLET | Freq: Two times a day (BID) | ORAL | Status: DC
Start: 2014-05-16 — End: 2014-05-18
  Administered 2014-05-16 – 2014-05-18 (×4): 3 mg via ORAL
  Filled 2014-05-16 (×4): qty 6

## 2014-05-16 MED ORDER — ONDANSETRON HCL 4 MG PO TABS: 4.0000 mg | ORAL_TABLET | Freq: Three times a day (TID) | ORAL | Status: DC | PRN

## 2014-05-16 MED ORDER — CARBAMAZEPINE ER 200 MG PO TB12
200.0000 mg | ORAL_TABLET | Freq: Two times a day (BID) | ORAL | Status: DC
  Administered 2014-05-16 – 2014-05-18 (×4): 200 mg via ORAL
  Filled 2014-05-16 (×6): qty 1

## 2014-05-16 MED ORDER — ZOLPIDEM TARTRATE 5 MG PO TABS: 5.0000 mg | ORAL_TABLET | Freq: Every evening | ORAL | Status: DC | PRN

## 2014-05-16 MED ORDER — ACETAMINOPHEN 325 MG PO TABS: 650.0000 mg | ORAL_TABLET | ORAL | Status: DC | PRN
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
## 2014-05-16 NOTE — BH Assessment (Signed)
Received call for assessment. Spoke with Marlon Peliffany Greene, PA-C who said Pt has a history of schizoaffective disorder and [facility]. Pt reports current SI with plan. Tele-assessment will be initiated.  Harlin RainFord Ellis Ria CommentWarrick Jr, LPC, [facility] Triage Specialist [phone]

## 2014-05-16 NOTE — ED Notes (Signed)
Staffing office notified of sitter need.  Chg rn made aware of  Pt.  Getting into scrubs   Will call security to  Wand her

## 2014-05-16 NOTE — ED Notes (Signed)
Diet tray-no sharps ordered for pt.

## 2014-05-16 NOTE — ED Provider Notes (Signed)
CSN: [id number]     Arrival date & time 05/16/14  1630 History   First MD Initiated Contact with Patient 05/16/14 1652     Chief Complaint  Patient presents with  . Suicidal     (Consider location/radiation/quality/duration/timing/severity/associated sxs/prior Treatment) HPI  Patient to the ED for evaluation of suicidal thoughts. She has schizoaffective disorder, bipolar, depression, anxiety. S[facility] but she started thinking about her son who is ignoring her and now she is feeling suicidal again. SHe says her plans are to slit her wrists or "another way". Prior suicide attempt many years ago. Denies homicidal ideation, substance abuse, hallucinations, delusions. Denies any medical complaints. Denies any actions to harm herself prior to arrival.  Past Medical History  Diagnosis Date  . Gastro - esophageal reflux disease   . Schizoaffective disorder   . Bipolar mood disorder   . Bipolar affective     complient with meds  . Depression   . Anxiety    Past Surgical History  Procedure Laterality Date  . Tubal ligation     Family History  Problem Relation Age of Onset  . Diabetes Mother   . Heart failure Father    History  Substance Use Topics  . Smoking status: Current Every Day Smoker -- 2.00 packs/day for 20 years    Types: Cigarettes  . Smokeless tobacco: Not on file  . Alcohol Use: No   OB History   Grav Para Term Preterm Abortions TAB SAB Ect Mult Living                 Review of Systems  Psychiatric/Behavioral: Positive for suicidal ideas.  All other systems reviewed and are negative.   Allergies  Benzodiazepines and Penicillins  Home Medications   Prior to Admission medications   Medication Sig Start Date End Date Taking? Authorizing Provider  benztropine (COGENTIN) 0.5 MG tablet Take 1 tablet (0.5 mg total) by mouth 2 (two) times daily. For medication induced involuntary movements 05/14/14  Yes Velna HatchetSheila May Agustin, NP   carbamazepine (TEGRETOL XR) 200 MG 12 hr tablet Take 1 tablet (200 mg total) by mouth 2 (two) times daily. For mood stabilization 05/14/14  Yes Velna HatchetSheila May Agustin, NP  citalopram (CELEXA) 40 MG tablet Take 1 tablet (40 mg total) by mouth daily before breakfast. For depression 05/14/14  Yes Velna HatchetSheila May Agustin, NP  gabapentin (NEURONTIN) 100 MG capsule Take 2 capsules (200 mg total) by mouth 3 (three) times daily. For pain 05/14/14  Yes Velna HatchetSheila May Agustin, NP  mirtazapine (REMERON) 7.5 MG tablet Take 1 tablet (7.5 mg total) by mouth at bedtime. For insomnia and depression 05/14/14  Yes Velna HatchetSheila May Agustin, NP  risperiDONE (RISPERDAL) 3 MG tablet Take 1 tablet (3 mg total) by mouth 2 (two) times daily. For mood control 05/14/14  Yes Velna HatchetSheila May Agustin, NP   BP 104/62  Pulse 67  Temp(Src) 98.7 F (37.1 C) (Oral)  Resp 16  Ht 5\' 4"  (1.626 m)  Wt 179 lb (81.194 kg)  BMI 30.71 kg/m2  SpO2 95%  LMP 07/23/2004 Physical Exam Nursing note and vitals reviewed.  Constitutional: She is oriented to person, place, and time. She appears well-developed and well-nourished. No distress.  HENT:  Head: Normocephalic and atraumatic.  Mouth/Throat: Oropharynx is clear and moist.  Poor dentition  Eyes: Conjunctivae and EOM are normal. Pupils are equal, round, and reactive to light.  Neck: Normal range of motion. Neck supple.  Cardiovascular: Normal rate,  regular rhythm and normal heart sounds.  Pulmonary/Chest: Effort normal and breath sounds normal. No respiratory distress. She has no wheezes.  Abdominal: Soft. Bowel sounds are normal. There is no tenderness. There is no guarding.  Musculoskeletal: Normal range of motion.  Neurological: She is alert and oriented to person, place, and time.  Skin: Skin is warm and dry. She is not diaphoretic.  Psychiatric: She has a normal mood and affect. She is not withdrawn and not actively hallucinating. She expresses suicidal ideation. She expresses no homicidal  ideation. She expresses suicidal plans. She expresses no homicidal plans.  Endorses active SI with plan; denies HI/AVH   ED Course  Procedures (including critical care time) Labs Review Labs Reviewed  CBC - Abnormal; Notable for the following:    Hemoglobin 16.4 (*)    All other components within normal limits  COMPREHENSIVE METABOLIC PANEL - Abnormal; Notable for the following:    Glucose, Bld 107 (*)    Albumin 3.4 (*)    Total Bilirubin <0.2 (*)    GFR calc non Af Amer 55 (*)    GFR calc Af Amer 64 (*)    All other components within normal limits  SALICYLATE LEVEL - Abnormal; Notable for the following:    Salicylate Lvl <2.0 (*)    All other components within normal limits  ACETAMINOPHEN LEVEL  ETHANOL  URINE RAPID DRUG SCREEN (HOSP PERFORMED)    Imaging Review No results found.   EKG Interpretation None      MDM   Final diagnoses:  Schizoaffective disorder, unspecified type  Suicidal ideation    She returns with suicidal ideation and has a vague plan. Just released from 9 day stay in the psychiatric facility.  Will consult TTS Psych holding orders placed. Reviewed home meds.  Filed Vitals:   05/16/14 1729  BP: 104/62  Pulse: 67  Temp: 98.7 F (37.1 C)  Resp: [address]16       Rashod Gougeon G Taylin Leder, PA-C 05/16/14 1818

## 2014-05-16 NOTE — BH Assessment (Signed)
Assessment complete. Binnie RailJoann Glover, [facility], confirmed that adult unit is currently at capacity. Gave clinical report to Janann Augustori Burkett, NP who said Pt meets criteria for inpatient psychiatric treatment. TTS will contact other facilities for placement. Notified Marlon Peliffany Greene, PA-C and Tomika, RN of recommendation.   Harlin RainFord Ellis Ria CommentWarrick Jr, LPC, [facility] Triage Specialist [phone]

## 2014-05-16 NOTE — ED Notes (Signed)
Patient will need inpatient placement, and there are no places at behavioral health at this time, so Dr. Ala DachFord is seeking placement elsewhere per phone conversation today.

## 2014-05-16 NOTE — ED Notes (Signed)
The pt was just discharged from behavorial health Friday and she is reporting that she is suicidal today

## 2014-05-17 NOTE — Progress Notes (Signed)
MHT initiated inpatient treatment referrals to the following facilities with bed availability:  FAX REFERRALS 1)[facility]  [facility] 1)[facility] 2)[facility] 3)[facility] 4)[facility] 5)[facility] 6)[facility] 7)[facility] 8)[facility] 9)[facility] 10)[facility] 11)[facility] 12)[facility]  Blain PaisMichelle L Eisley Barber, MHT/NS

## 2014-05-17 NOTE — BH Assessment (Addendum)
Pt reassessment via telepsych:  Pt is cooperative and oriented x 4. Her affect is depressed. She denies SI and HI. She denies [facility] and no delusions noted. Pt reports she is compliant with her psych meds and is enrolled in [facility] Team. Pt says, "I'd really like to go home." She reports that she has the "pill packs" from [facility] and says "they (pills) aren't enough to overdose on." She says that she hasn't felt upset about her relationship with her son today. Per chart review, [facility]14/14 for SI. Writer ran pt by Claudette Headonrad Withrow NP who recommends inpatient treatment d/t pt's two prior suicide attempts and her plan to cut her wrists with a butcher knife yesterday.   TC to Manus RuddBelinda Gant from her [facility] ACTT Team - [address], [place], [place] [address]  Phone: [phone] Fax: [phone]. Ms Charyl DancerGant reports that pt is about to get [facility] d/t her lack of personal hygiene. Ms Charyl DancerGant reports that pt often vomits and has bowel movements on herself. She sts that pt had previously lived in an assisted living facility and that now [facility] is looking into getting her into a group home situation or an ALF. Gant reports that [facility] staff plan to go to remove all sharps from pt's housing tomorrow. She states they give pt a two day supply of meds at a time but they will likely change that to a one day supply at a time.  Evette Cristalaroline Paige Shawntae Lowy, [place] Assessment Counselor

## 2014-05-17 NOTE — ED Notes (Signed)
Spoke to Dr. Mora Bellmanni in regards to patient's blood pressure.  Reviewed medications recently received. Patient is not lighthead. No new orders at this time. Dr. Mora Bellmanni coming to see the patient.

## 2014-05-17 NOTE — ED Provider Notes (Signed)
Filed Vitals:   05/17/14 0551  BP: 121/83  Pulse: 61  Temp: 98.7 F (37.1 C)  Resp: 18   Results for orders placed during the hospital encounter of 05/16/14  ACETAMINOPHEN LEVEL      Result Value Ref Range   Acetaminophen (Tylenol), Serum <15.0  10 - 30 ug/mL  CBC      Result Value Ref Range   WBC 7.4  4.0 - 10.5 K/uL   RBC 5.03  3.87 - 5.11 MIL/uL   Hemoglobin 16.4 (*) 12.0 - 15.0 g/dL   HCT 02.745.8  25.336.0 - 66.446.0 %   MCV 91.1  78.0 - 100.0 fL   MCH 32.6  26.0 - 34.0 pg   MCHC 35.8  30.0 - 36.0 g/dL   RDW 40.313.4  47.411.5 - 25.915.5 %   Platelets 249  150 - 400 K/uL  COMPREHENSIVE METABOLIC PANEL      Result Value Ref Range   Sodium 137  137 - 147 mEq/L   Potassium 4.2  3.7 - 5.3 mEq/L   Chloride 97  96 - 112 mEq/L   CO2 28  19 - 32 mEq/L   Glucose, Bld 107 (*) 70 - 99 mg/dL   BUN 12  6 - 23 mg/dL   Creatinine, Ser 5.631.10  0.50 - 1.10 mg/dL   Calcium 9.2  8.4 - 87.510.5 mg/dL   Total Protein 7.4  6.0 - 8.3 g/dL   Albumin 3.4 (*) 3.5 - 5.2 g/dL   AST 22  0 - 37 U/L   ALT 28  0 - 35 U/L   Alkaline Phosphatase 86  39 - 117 U/L   Total Bilirubin <0.2 (*) 0.3 - 1.2 mg/dL   GFR calc non Af Amer 55 (*) >90 mL/min   GFR calc Af Amer 64 (*) >90 mL/min   Anion gap 12  5 - 15  ETHANOL      Result Value Ref Range   Alcohol, Ethyl (B) <11  0 - 11 mg/dL  SALICYLATE LEVEL      Result Value Ref Range   Salicylate Lvl <2.0 (*) 2.8 - 20.0 mg/dL  URINE RAPID DRUG SCREEN (HOSP PERFORMED)      Result Value Ref Range   Opiates NONE DETECTED  NONE DETECTED   Cocaine NONE DETECTED  NONE DETECTED   Benzodiazepines NONE DETECTED  NONE DETECTED   Amphetamines NONE DETECTED  NONE DETECTED   Tetrahydrocannabinol NONE DETECTED  NONE DETECTED   Barbiturates NONE DETECTED  NONE DETECTED   No results found.  Pt without complaints.  Awaiting placement  Rolan BuccoMelanie Brandon Scarbrough, MD 05/17/14 [phone]

## 2014-05-17 NOTE — ED Notes (Signed)
Dr. Mora Bellmanni rounded on patient, no new orders at this time.

## 2014-05-17 NOTE — ED Notes (Signed)
Called [facility] to check on status of pt dispo

## 2014-05-17 NOTE — ED Notes (Signed)
Called BH and spoke with Inetta Fermoina to get Pt reevaluated due to her "wanting to go home" and "not wanting to hurt myself anymore."

## 2014-05-17 NOTE — ED Notes (Signed)
TTS REEVAL COMPLETED.

## 2014-05-18 ENCOUNTER — Encounter (HOSPITAL_COMMUNITY): Payer: Self-pay | Admitting: Psychiatry

## 2014-05-18 ENCOUNTER — Other Ambulatory Visit: Payer: Self-pay

## 2014-05-18 ENCOUNTER — Inpatient Hospital Stay (HOSPITAL_COMMUNITY)
Admission: AD | Admit: 2014-05-18 | Discharge: 2014-05-25 | DRG: 885 | Disposition: A | Discharge status: Home / Self Care | Payer: Medicare Other | Source: Intra-hospital | Attending: Psychiatry | Admitting: Psychiatry

## 2014-05-18 DIAGNOSIS — Z833 Family history of diabetes mellitus: Secondary | ICD-10-CM | POA: Diagnosis not present

## 2014-05-18 DIAGNOSIS — F251 Schizoaffective disorder, depressive type: Secondary | ICD-10-CM | POA: Diagnosis present

## 2014-05-18 DIAGNOSIS — F17210 Nicotine dependence, cigarettes, uncomplicated: Secondary | ICD-10-CM | POA: Diagnosis present

## 2014-05-18 DIAGNOSIS — G4700 Insomnia, unspecified: Secondary | ICD-10-CM | POA: Diagnosis present

## 2014-05-18 DIAGNOSIS — K219 Gastro-esophageal reflux disease without esophagitis: Secondary | ICD-10-CM | POA: Diagnosis present

## 2014-05-18 DIAGNOSIS — E785 Hyperlipidemia, unspecified: Secondary | ICD-10-CM | POA: Diagnosis present

## 2014-05-18 DIAGNOSIS — R45851 Suicidal ideations: Secondary | ICD-10-CM | POA: Diagnosis present

## 2014-05-18 DIAGNOSIS — F419 Anxiety disorder, unspecified: Secondary | ICD-10-CM | POA: Diagnosis present

## 2014-05-18 DIAGNOSIS — Z8249 Family history of ischemic heart disease and other diseases of the circulatory system: Secondary | ICD-10-CM | POA: Diagnosis not present

## 2014-05-18 DIAGNOSIS — R4586 Emotional lability: Secondary | ICD-10-CM | POA: Diagnosis present

## 2014-05-18 MED ORDER — MAGNESIUM HYDROXIDE 400 MG/5ML PO SUSP: 30.0000 mL | Freq: Every day | ORAL | Status: DC | PRN

## 2014-05-18 MED ORDER — ACETAMINOPHEN 325 MG PO TABS: 650.0000 mg | ORAL_TABLET | Freq: Four times a day (QID) | ORAL | Status: DC | PRN

## 2014-05-18 MED ORDER — RISPERIDONE 3 MG PO TABS
3.0000 mg | ORAL_TABLET | Freq: Two times a day (BID) | ORAL | Status: DC
  Administered 2014-05-18 – 2014-05-25 (×14): 3 mg via ORAL
  Filled 2014-05-18: qty 8
  Filled 2014-05-18 (×4): qty 1
  Filled 2014-05-18: qty 8
  Filled 2014-05-18 (×2): qty 1
  Filled 2014-05-18: qty 8
  Filled 2014-05-18 (×5): qty 1
  Filled 2014-05-18: qty 8
  Filled 2014-05-18 (×3): qty 1

## 2014-05-18 MED ORDER — GABAPENTIN 100 MG PO CAPS
200.0000 mg | ORAL_CAPSULE | Freq: Three times a day (TID) | ORAL | Status: DC
  Administered 2014-05-18 – 2014-05-25 (×21): 200 mg via ORAL
  Filled 2014-05-18 (×5): qty 2
  Filled 2014-05-18 (×2): qty 18
  Filled 2014-05-18: qty 2
  Filled 2014-05-18: qty 18
  Filled 2014-05-18 (×4): qty 2
  Filled 2014-05-18: qty 18
  Filled 2014-05-18: qty 2
  Filled 2014-05-18: qty 18
  Filled 2014-05-18 (×3): qty 2
  Filled 2014-05-18: qty 18
  Filled 2014-05-18 (×8): qty 2

## 2014-05-18 MED ORDER — ENSURE COMPLETE PO LIQD
237.0000 mL | Freq: Two times a day (BID) | ORAL | Status: DC
  Administered 2014-05-18 – 2014-05-25 (×15): 237 mL via ORAL

## 2014-05-18 MED ORDER — HALOPERIDOL 5 MG PO TABS: 5.0000 mg | ORAL_TABLET | Freq: Four times a day (QID) | ORAL | Status: DC | PRN

## 2014-05-18 MED ORDER — BENZTROPINE MESYLATE 1 MG PO TABS: 1.0000 mg | ORAL_TABLET | Freq: Four times a day (QID) | ORAL | Status: DC | PRN

## 2014-05-18 MED ORDER — MIRTAZAPINE 7.5 MG PO TABS
7.5000 mg | ORAL_TABLET | Freq: Every day | ORAL | Status: DC
  Administered 2014-05-18: 7.5 mg via ORAL
  Filled 2014-05-18 (×2): qty 1

## 2014-05-18 MED ORDER — DIVALPROEX SODIUM ER 500 MG PO TB24
500.0000 mg | ORAL_TABLET | Freq: Every evening | ORAL | Status: DC
  Administered 2014-05-18: 500 mg via ORAL
  Filled 2014-05-18 (×2): qty 1

## 2014-05-18 MED ORDER — BENZTROPINE MESYLATE 0.5 MG PO TABS
0.5000 mg | ORAL_TABLET | Freq: Two times a day (BID) | ORAL | Status: DC
  Administered 2014-05-18 – 2014-05-25 (×14): 0.5 mg via ORAL
  Filled 2014-05-18: qty 8
  Filled 2014-05-18 (×4): qty 1
  Filled 2014-05-18: qty 8
  Filled 2014-05-18 (×4): qty 1
  Filled 2014-05-18: qty 8
  Filled 2014-05-18 (×6): qty 1
  Filled 2014-05-18: qty 8

## 2014-05-18 MED ORDER — HYDROXYZINE HCL 25 MG PO TABS: 25.0000 mg | ORAL_TABLET | Freq: Three times a day (TID) | ORAL | Status: DC | PRN

## 2014-05-18 MED ORDER — ALUM & MAG HYDROXIDE-SIMETH 200-200-20 MG/5ML PO SUSP: 30.0000 mL | ORAL | Status: DC | PRN

## 2014-05-18 NOTE — Plan of Care (Signed)
Problem: Consults Goal: Suicide Risk Patient Education (See Patient Education module for education specifics)  Outcome: Completed/Met Date Met:  05/18/14 Patient denied SI, will talk with staff if assistance needed.        

## 2014-05-18 NOTE — Progress Notes (Addendum)
[facility].  During admission, patient denied SI and HI.  Denied A/V hallucinations.  Denied pain.  Patient stated she has had suicidal thoughts, that she did not want to live, does not know why she feels this way.  [facility] and they are good to her.  Told her supervisor that she felt suicidal and arrangements were made for patient to be brought to the hospital.  Stated she does not actually feel suicidal at this time.  Had thoughts previously to cut wrists with butcher knife.  Denied HI.  Denied A/V hallucinations.  Denied pain.  Stated she has been eating and sleeping well.  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  Has high school diploma and one year technical school.  Patient denied THC and drug use.  Stated she has not drank alcohol for 10 years or more.  Son Kandee KeenCory age 55 years old will not talk to you.  Patient smokes 2 packs cigarettes daily, declined nicotine patch at this time.  Rated depression, anxiety, hopelessness #3.  Patient has tatoo on right ankle and across lower back.    Patient has been cooperative and pleasant.  Food/drink given patient. Fall risk information given to patient. [employer] shoes, shirt, underwear, pants.

## 2014-05-18 NOTE — Progress Notes (Signed)
The focus of this group is to help patients review their daily goal of treatment and discuss progress on daily workbooks. Pt attended the evening group session and responded to all discussion prompts from the Writer. Pt shared that today was a good day on the unit, the highlight of which was being transferred here from the emergency department. "I'd rather have gone home, but this is a better place to be in than the ED." Pt told the group she was feeling better and hoped to go home soon. When asked how she knew when she was well, Pt responded, "I know I'm doing well when I'm taking care of myself, physically. When I get up out of bed and put makeup on, it makes everything else a little bit better." Pt's affect was appropriate.

## 2014-05-18 NOTE — Progress Notes (Signed)
EKG completed and put on front of patient's chart.
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
## 2014-05-18 NOTE — [facility] Suicide Risk Assessment (Signed)
   Nursing information obtained from:    Demographic factors:    Current Mental Status:    Loss Factors:    Historical Factors:    Risk Reduction Factors:    Total Time spent with patient: 45 minutes  CLINICAL FACTORS:   Previous Psychiatric Diagnoses and Treatments Medical Diagnoses and Treatments/Surgeries  Psychiatric Specialty Exam: Physical Exam Please see H&P.   ROS  Blood pressure 104/58, pulse 73, temperature 98.5 F (36.9 C), temperature source Oral, resp. rate 16, height 5\' 2"  (1.575 m), weight 85.276 kg (188 lb), last menstrual period 07/23/2004.Body mass index is 34.38 kg/(m^2).  Please see H&P for MSE      SUICIDE RISK:   Mild:  Suicidal ideation of limited frequency, intensity, duration, and specificity.  There are no identifiable plans, no associated intent, mild dysphoria and related symptoms, good self-control (both objective and subjective assessment), few other risk factors, and identifiable protective factors, including available and accessible social support.  PLAN OF CARE:Please see H&P.   I certify that inpatient services furnished can reasonably be expected to improve the patient's condition.  Etoile Looman MD 05/18/2014, 1:54 PM

## 2014-05-18 NOTE — ED Provider Notes (Signed)
12:15 PM  [facility] per Dr. Alpha GulaEpban.  Will transfer.  Rebecca MawKristen N Ward, DO 05/18/14 1216

## 2014-05-18 NOTE — Progress Notes (Signed)
Patient ID: [name], female   DOB: 1961/07/17, 53 y.o.   MRN: [id number]  D: Pt approached the writer and said, "I'm back", while half smiling, Stated she returned to her apt after her discharge from [facility]. "I started feeling bad Sunday". Pt informed the writer that she "thinks the Dr is started her on depakote", but was told "the dr was going to do lithium". [facility] plans [facility] once she's discharged.   A:  Support and encouragement was offered. 15 min checks continued for safety.  R: Pt remains safe.

## 2014-05-18 NOTE — ED Provider Notes (Signed)
Medical screening examination/treatment/procedure(s) were performed by non-physician practitioner and as supervising physician I was immediately available for consultation/collaboration.   EKG Interpretation None        Matthew Gentry, MD 05/18/14 0128 

## 2014-05-18 NOTE — Progress Notes (Signed)
Patient ID: [name], female   DOB: [birth year], 53 y.o.   MRN: [id number] PER STATE REGULATIONS 482.30  THIS CHART WAS REVIEWED FOR MEDICAL NECESSITY WITH RESPECT TO THE PATIENT'S ADMISSION/DURATION OF STAY.  NEXT REVIEW DATE: 05/21/14.  Loura HaltBARBARA Jasper Ruminski, RN, BSN CASE MANAGER

## 2014-05-18 NOTE — Tx Team (Signed)
Initial Interdisciplinary Treatment Plan   PATIENT STRESSORS: Financial difficulties   PROBLEM LIST: Problem List/Patient Goals Date to be addressed Date deferred Reason deferred Estimated date of resolution  "suicidal thoughts" 05/18/2014   D/c        "depression" 05/18/2014   D/c        "anxiety" 05/18/2014   D/c                           DISCHARGE CRITERIA:  Ability to meet basic life and health needs Improved stabilization in mood, thinking, and/or behavior Motivation to continue treatment in a less acute level of care Need for constant or close observation no longer present Reduction of life-threatening or endangering symptoms to within safe limits Verbal commitment to aftercare and medication compliance  PRELIMINARY DISCHARGE PLAN: Attend aftercare/continuing care group Attend PHP/IOP Outpatient therapy Return to previous living arrangement  PATIENT/FAMIILY INVOLVEMENT: This treatment plan has been presented to and reviewed with the patient, [name],and [facility] with depression, anxiety, suicidal thoughts.  The patient and family have been given the opportunity to ask questions and make suggestions.  [name], [name] 05/18/2014, 3:28 PM

## 2014-05-19 MED ORDER — MIRTAZAPINE 15 MG PO TABS
15.0000 mg | ORAL_TABLET | Freq: Every day | ORAL | Status: DC
  Administered 2014-05-19 – 2014-05-24 (×6): 15 mg via ORAL
  Filled 2014-05-19: qty 4
  Filled 2014-05-19: qty 1
  Filled 2014-05-19: qty 4
  Filled 2014-05-19 (×5): qty 1

## 2014-05-19 MED ORDER — DIVALPROEX SODIUM ER 250 MG PO TB24
750.0000 mg | ORAL_TABLET | Freq: Every evening | ORAL | Status: DC
  Administered 2014-05-19 – 2014-05-24 (×6): 750 mg via ORAL
  Filled 2014-05-19 (×2): qty 3
  Filled 2014-05-19: qty 12
  Filled 2014-05-19 (×2): qty 3
  Filled 2014-05-19: qty 12
  Filled 2014-05-19 (×3): qty 3

## 2014-05-19 NOTE — Progress Notes (Signed)
Patient Discharge Instructions:  Next Level Care Provider Has Access to the EMR, 05/19/14 [facility]  The entire medical record is made available via CHL/Epic access.  Rebecca ReddenSheena E Box, 05/19/2014, 2:22 PM

## 2014-05-19 NOTE — Progress Notes (Signed)
Adult Psychoeducational Group Note  Date:  05/19/2014 Time:  11:08 PM  Group Topic/Focus:  Wrap-Up Group:   The focus of this group is to help patients review their daily goal of treatment and discuss progress on daily workbooks.  Participation Level:  Minimal  Participation Quality:  Appropriate  Affect:  Flat  Cognitive:  Oriented  Insight: Limited  Engagement in Group:  Limited  Modes of Intervention:  Socialization and Support  Additional Comments:  Patient attended and participated in group tonight. She reports having a good day. She is excited to be leaving tomorrow to a new place. Today she attended her groups, went for her meals and took her medications. The patient advised that today in group she learnt about resiliency, perseverance and being vulnerable. She stated in her life, she can be vulnerable when she goes to her new placement because that would be something new for her.    [name], [name] [facility] 05/19/2014, 11:08 PM

## 2014-05-19 NOTE — Plan of Care (Signed)
Problem: Ineffective individual coping Goal: STG: Patient will remain free from self harm Outcome: Completed/Met Date Met:  05/19/14 Patient has not self harmed while she has been in the hospital

## 2014-05-19 NOTE — [facility] Counselor (Signed)
Adult Psychosocial Assessment Update Interdisciplinary Team  Previous Behavior Health Hospital admissions/discharges:  Admissions Discharges  Date:  05/04/14 Date:  Date: 10/13 Date:  Date: 8/12 Date:  Date: 4/12 Date:  Date: 3/12 Date:   Changes since the last Psychosocial Assessment (including adherence to outpatient mental health and/or substance abuse treatment, situational issues contributing to decompensation and/or relapse). [name] left us last Friday, October 23.  Her ACT team picked her up and [facility].  By the weekend she was suicidal and threatening to cut herself with a knife.  She returned to us, and we are working on placement again.             Discharge Plan 1. Will you be returning to the same living situation after discharge?   Yes: No:  X    If no, what is your plan?ALF, GH           2. Would you like a referral for services when you are discharged? Yes:     If yes, for what services?  No:   X  Already open with [facility] ACT team.           Summary and Recommendations (to be completed by the evaluator) [name] is a [age] YO Caucasian female who has a severe and persistent mental illness.  She currently requires more external structure in her life, and is unable to maintain in her living situation.  We were unable to place her the last time she was here.  We will try again.  In the meantime, she can benefit from crises stabilization, medication management, therapeutic milieu and coordination with her ACT team.                       Signature:  Ida Rogueorth, Nashid Pellum B, 05/19/2014 11:24 AM

## 2014-05-19 NOTE — Progress Notes (Signed)
D: Patient in the dayroom on approach.  Patient appears flat and depressed.  Patient states her depression continued and that is why she is here.  Patient states she was just released from [facility] a few days ago.  Patient states her goal for today was to attend all groups.  Patient states she met her goal because she attended all groups.  Patient denies SI/HI and denies AVH. A: Staff to monitor Q 15 mins for safety.  Encouragement and support offered.  Scheduled medications administered per orders. R: Patient remains safe on the unit.  Patient attended group tonight.  Patient visible on the unit and interacting with peers.  Patient taking administered medications.

## 2014-05-19 NOTE — [facility] Group Notes (Signed)
[facility] LCSW Group Therapy  05/19/2014 1:26 PM  Type of Therapy:  Group Therapy  Participation Level:  None  Participation Quality:  Attentive  Affect:  Depressed and Flat  Cognitive:  Alert  Insight:  Limited  Engagement in Therapy:  Limited  Modes of Intervention:  Discussion, Education, Socialization and Support  Summary of Progress/Problems:Mental [employer] Association (MHA) speaker came to talk about his personal journey with substance abuse and mental illness. Group members were challenged to process ways by which to relate to the speaker. MHA speaker provided handouts and educational information pertaining to groups and services offered by the [facility]. [name] attended group and stayed the entire time. She sat quietly and listened to the guest speaker.     Duncan,Rebecca 05/19/2014, 1:26 PM

## 2014-05-19 NOTE — Progress Notes (Signed)
[facility] Progress Note  05/19/2014 10:50 AM [name]  MRN:  [id number] Subjective: Patient states ,'I am all right , I feel better today" Objective: Patient seen and chart reviewed. Pt unable to talk about her psychosocial triggers at this point. The relational struggles that she has with her son is one of her stressors. Pt reports loneliness which could have been a reason. Reports mood as improving today. Denies any new complaints today. Patient reports sleep as fair and appetite as good. During her last admission she was not taking care of her ADLs ,but that has changed . She appears fairly groomed and seems to take care of self. Patient denies SI/HI/AH/VH. Patient is compliant on medications. Patient denies any side effects.    Diagnosis:   DSM5:   Primary Psychiatric Diagnosis:  Schizoaffective disorder,depressive type ,multiple episodes ,currently in acute episode    Non Psychiatric Diagnosis:  GERD  Hyperlipidemia        Total Time spent with patient: 30 minutes   ADL's:  Intact  Sleep: Fair  Appetite:  Good   Psychiatric Specialty Exam: Physical Exam  ROS  Blood pressure 96/72, pulse 66, temperature 97.7 F (36.5 C), temperature source Oral, resp. rate 17, height 5\' 2"  (1.575 m), weight 85.276 kg (188 lb), last menstrual period 07/23/2004.Body mass index is 34.38 kg/(m^2).  General Appearance: Casual  Eye Contact::  Fair  Speech:  Clear and Coherent  Volume:  Normal  Mood:  Euthymic  Affect:  Congruent  Thought Process:  Coherent  Orientation:  Full (Time, Place, and Person)  Thought Content:  WDL  Suicidal Thoughts:  No  Homicidal Thoughts:  No  Memory:  Immediate;   Fair Recent;   Fair Remote;   Fair  Judgement:  Impaired  Insight:  Fair  Psychomotor Activity:  Normal  Concentration:  Fair  Recall:  [employer]Fair  Fund of Knowledge:Fair  Language: Good  Akathisia:  No  Handed:  Right  AIMS (if indicated):     Assets:  Communication Skills Desire  for Improvement  Sleep:  Number of Hours: 6.75   Musculoskeletal: Strength & Muscle Tone: within normal limits Gait & Station: normal Patient leans: N/A  Current Medications: Current Facility-Administered Medications  Medication Dose Route Frequency Provider Last Rate Last Dose  . acetaminophen (TYLENOL) tablet 650 mg  650 mg Oral Q6H PRN Jomarie LongsSaramma Benigna Delisi, MD      . alum & mag hydroxide-simeth (MAALOX/MYLANTA) 200-200-20 MG/5ML suspension 30 mL  30 mL Oral Q4H PRN Jomarie LongsSaramma Luci Bellucci, MD      . benztropine (COGENTIN) tablet 0.5 mg  0.5 mg Oral BID Jomarie LongsSaramma Koron Godeaux, MD   0.5 mg at 05/19/14 0831  . haloperidol (HALDOL) tablet 5 mg  5 mg Oral Q6H PRN Jomarie LongsSaramma Krislyn Donnan, MD       And  . benztropine (COGENTIN) tablet 1 mg  1 mg Oral Q6H PRN Vaida Kerchner, MD      . divalproex (DEPAKOTE ER) 24 hr tablet 750 mg  750 mg Oral QPM Meiah Zamudio, MD      . feeding supplement (ENSURE COMPLETE) (ENSURE COMPLETE) liquid 237 mL  237 mL Oral BID BM Jusiah Aguayo, MD   237 mL at 05/19/14 0832  . gabapentin (NEURONTIN) capsule 200 mg  200 mg Oral TID Jomarie LongsSaramma Josephmichael Lisenbee, MD   200 mg at 05/19/14 0831  . hydrOXYzine (ATARAX/VISTARIL) tablet 25 mg  25 mg Oral TID PRN Jomarie LongsSaramma Brandii Lakey, MD      . magnesium hydroxide (MILK OF MAGNESIA) suspension  30 mL  30 mL Oral Daily PRN Jomarie LongsSaramma Tishawn Friedhoff, MD      . mirtazapine (REMERON) tablet 15 mg  15 mg Oral QHS Madeleyn Schwimmer, MD      . risperiDONE (RISPERDAL) tablet 3 mg  3 mg Oral BID Jomarie LongsSaramma Logun Colavito, MD   3 mg at 05/19/14 0831    Lab Results: No results found for this or any previous visit (from the past 48 hour(s)).  Physical Findings: AIMS: Facial and Oral Movements Muscles of Facial Expression: None, normal Lips and Perioral Area: None, normal Jaw: None, normal Tongue: None, normal,Extremity Movements Upper (arms, wrists, hands, fingers): None, normal Lower (legs, knees, ankles, toes): None, normal, Trunk Movements Neck, shoulders, hips: None, normal, Overall Severity Severity  of abnormal movements (highest score from questions above): None, normal Incapacitation due to abnormal movements: None, normal Patient's awareness of abnormal movements (rate only patient's report): No Awareness, Dental Status Current problems with teeth and/or dentures?: No Does patient usually wear dentures?: No  CIWA:  CIWA-Ar Total: 3 COWS:  COWS Total Score: 2  Treatment Plan Summary: Daily contact with patient to assess and evaluate symptoms and progress in treatment Medication management  Plan: Patient is a [age] y old CF who is divorced ,who has a hx of schizoaffective disorder ,who presented with SI with a plan to cut her wrist. [facility] ,her ACTT was working on placement. However pt reports sudden worsening depression when she felt suicidal ,and was admitted again. Pt however currently reports improvement in her mood sx.  Will continue to titrate her Depakote to reach therapeutic level.  Will continue Risperdal 3 mg po bid. Will continue Cogentin 0.5 mg po bid. Will increase Remeron to 15 mg po qhs. Will continue Gabapentin 200 mg po tid for anxiety ,mood lability. Will make prn medications available for agitation /anxiety.  CSW will work on disposition. Reviewed labs .     Medical Decision Making Problem Points:  Established problem, stable/improving (1) and Review of last therapy session (1) Data Points:  Order Aims Assessment (2) Review or order clinical lab tests (1) Review of medication regiment & side effects (2)  I certify that inpatient services furnished can reasonably be expected to improve the patient's condition.   Trevor Duty MD 05/19/2014, 10:50 AM

## 2014-05-19 NOTE — Progress Notes (Signed)
Patient ID: [name], female   DOB: [birth year], 53 y.o.   MRN: [id number]  D: Pt. Denies SI/HI and A/V Hallucinations to this Clinical research associatewriter. Patient rates her depression, anxiety, and hopelessness at 2/10 for the day. Patient does not report any pain or discomfort at this time. Patient states she slept fair last night. She reports a good appetite, normal energy level and concentration level is good.  A: Support and encouragement provided to the patient to come to Clinical research associatewriter with questions or concerns. Scheduled medications administered to patient per physician's orders.  R: Patient is receptive and cooperative but minimal. Patient is seen in the milieu at times but can be isolative at times. Q15 minute checks are maintained for safety.

## 2014-05-19 NOTE — Tx Team (Signed)
  Interdisciplinary Treatment Plan Update   Date Reviewed:  05/19/2014  Time Reviewed:  8:18 AM  Progress in Treatment:   Attending groups: Yes Participating in groups: Minimally Taking medication as prescribed: Yes  Tolerating medication: Yes Family/Significant other contact made: Yes  ACT team  Patient understands diagnosis: Yes AEB asking for help with depression Discussing patient identified problems/goals with staff: Yes  See initial care plan Medical problems stabilized or resolved: Yes Denies suicidal/homicidal ideation: Yes  In tx team Patient has not harmed self or others: Yes  For review of initial/current patient goals, please see plan of care.  Estimated Length of Stay:  4-5 days  Reason for Continuation of Hospitalization: Depression Medication stabilization  New Problems/Goals identified:  N/A  Discharge Plan or Barriers:   Hoping for placement, follow up with [facility] ACT team  Additional Comments: [name] is a 53 year old caucasian female who is divorced ,has a hx of schizoaffective disorder ,[facility] of increased depression and suicidal thoughts. Pt has a history of schizoaffective disorder and [facility]05/14/14 due to suicidal ideation. Pt reported she felt okay after discharge but on 10/25 afternoon became depressed due to thinking about her poor relationship with her son, whom she says has not spoken to her in three years. Pt reported she felt suicidal with plan to cut her wrists with a butcher knife and contacted Belinda on her ACTT team. Belinda contacted law enforcement and Pt agreed to come to ED. Pt continuedto report suicidal ideation with plan and could not contract for safety.   Attendees:  Signature: Ivin BootySarama Eappen, MD 05/19/2014 8:18 AM   Signature: Richelle Itood Keayra Graham, LCSW 05/19/2014 8:18 AM  Signature: Fransisca KaufmannLaura Davis, NP 05/19/2014 8:18 AM  Signature: Marzetta Boardhrista Dopson, RN 05/19/2014 8:18 AM  Signature:  05/19/2014 8:18 AM  Signature:  05/19/2014 8:18 AM  Signature:   05/19/2014 8:18 AM  Signature:    Signature:    Signature:    Signature:    Signature:    Signature:      Scribe for Treatment Team:   Richelle Itood Shawnise Peterkin, LCSW  05/19/2014 8:18 AM

## 2014-05-20 NOTE — Progress Notes (Signed)
Patient ID: [name], female   DOB: [birth year], 53 y.o.   MRN: [id number] D: Patient in room sleeping  on approach. Pt mood and affect appeared depressed and flat. Pt report she did not set goal for self today. Pt denies SI/HI/AVH and pain. Cooperative with assessment. No acute distressed noted at this time.   A: Met with pt 1:1. Medications administered as prescribed. Writer encouraged pt to discuss feelings and attend groups. Pt encouraged to come to staff with any question or concerns.   R: Patient remains safe. She is complaint with medications and denies any adverse reaction.

## 2014-05-20 NOTE — Plan of Care (Signed)
Problem: Alteration in mood Goal: LTG-Pt's behavior demonstrates decreased signs of depression (Patient's behavior demonstrates decreased signs of depression to the point the patient is safe to return home and continue treatment in an outpatient setting)  Outcome: Progressing Patient states she is still depressed but states it is getting slightly better.  Patient [facility] so [facility] helps with her depression.

## 2014-05-20 NOTE — [facility] Group Notes (Signed)
[facility] LCSW Group Therapy  05/20/2014 2:58 PM   Type of Therapy:  Group Therapy  Participation Level:  Active  Participation Quality:  Attentive  Affect:  Appropriate  Cognitive:  Appropriate  Insight:  Improving  Engagement in Therapy:  Engaged  Modes of Intervention:  Clarification, Education, Exploration and Socialization  Summary of Progress/Problems: Today's group focused on relapse prevention.  We defined the term, and then brainstormed on ways to prevent relapse.  Sheralyn Boatmanoni sat quietly and did not offer anything spontaneously.  She identified recovery as getting back to a higher level of functioning.  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  Her hope is that she can get back to that place again someday.  Daryel Geraldorth, Elaria Osias B 05/20/2014 , 2:58 PM

## 2014-05-20 NOTE — Plan of Care (Signed)
Problem: Diagnosis: Increased Risk For Suicide Attempt Goal: STG-Patient Will Comply With Medication Regime Outcome: Completed/Met Date Met:  05/20/14 Patient is compliant with medication regimen

## 2014-05-20 NOTE — Progress Notes (Signed)
[facility]  05/20/2014 1:21 PM [name]  MRN:  [id number] Subjective: Patient states ,'I am OK ." Objective: Patient seen and chart reviewed. Pt continues to improve ,continues to be guarded. unable to talk about her psychosocial triggers at this point. Denies any new complaints today. Patient reports sleep as fair and appetite as good. During her last admission she was not taking care of her ADLs ,but that has changed . She appears fairly groomed and seems to take care of self. Patient denies SI/HI/AH/VH. Patient is compliant on medications. Patient denies any side effects.    Diagnosis:   DSM5:   Primary Psychiatric Diagnosis:  Schizoaffective disorder,depressive type ,multiple episodes ,currently in acute episode    Non Psychiatric Diagnosis:  GERD  Hyperlipidemia        Total Time spent with patient: 30 minutes   ADL's:  Intact  Sleep: Fair  Appetite:  Good   Psychiatric Specialty Exam: Physical Exam  ROS  Blood pressure 90/71, pulse 82, temperature 98.6 F (37 C), temperature source Oral, resp. rate 18, height 5\' 2"  (1.575 m), weight 85.276 kg (188 lb), last menstrual period 07/23/2004.Body mass index is 34.38 kg/(m^2).  General Appearance: Casual  Eye Contact::  Fair  Speech:  Clear and Coherent  Volume:  Normal  Mood:  Euthymic  Affect:  Congruent  Thought Process:  Coherent  Orientation:  Full (Time, Place, and Person)  Thought Content:  WDL  Suicidal Thoughts:  No  Homicidal Thoughts:  No  Memory:  Immediate;   Fair Recent;   Fair Remote;   Fair  Judgement:  Impaired  Insight:  Fair  Psychomotor Activity:  Normal  Concentration:  Fair  Recall:  [employer]Fair  Fund of Knowledge:Fair  Language: Good  Akathisia:  No  Handed:  Right  AIMS (if indicated):     Assets:  Communication Skills Desire for Improvement  Sleep:  Number of Hours: 6.75   Musculoskeletal: Strength & Muscle Tone: within normal limits Gait & Station: normal Patient  leans: N/A  Current Medications: Current Facility-Administered Medications  Medication Dose Route Frequency Provider Last Rate Last Dose  . acetaminophen (TYLENOL) tablet 650 mg  650 mg Oral Q6H PRN Jomarie LongsSaramma Quinci Gavidia, MD      . alum & mag hydroxide-simeth (MAALOX/MYLANTA) 200-200-20 MG/5ML suspension 30 mL  30 mL Oral Q4H PRN Jomarie LongsSaramma Mikyah Alamo, MD      . benztropine (COGENTIN) tablet 0.5 mg  0.5 mg Oral BID Jomarie LongsSaramma Devanie Galanti, MD   0.5 mg at 05/20/14 0751  . haloperidol (HALDOL) tablet 5 mg  5 mg Oral Q6H PRN Jomarie LongsSaramma Stevey Stapleton, MD       And  . benztropine (COGENTIN) tablet 1 mg  1 mg Oral Q6H PRN Romelle Reiley, MD      . divalproex (DEPAKOTE ER) 24 hr tablet 750 mg  750 mg Oral QPM Elhadji Pecore, MD   750 mg at 05/19/14 1707  . feeding supplement (ENSURE COMPLETE) (ENSURE COMPLETE) liquid 237 mL  237 mL Oral BID BM Latyra Jaye, MD   237 mL at 05/20/14 0750  . gabapentin (NEURONTIN) capsule 200 mg  200 mg Oral TID Jomarie LongsSaramma Veron Senner, MD   200 mg at 05/20/14 1207  . hydrOXYzine (ATARAX/VISTARIL) tablet 25 mg  25 mg Oral TID PRN Jomarie LongsSaramma Vora Clover, MD      . magnesium hydroxide (MILK OF MAGNESIA) suspension 30 mL  30 mL Oral Daily PRN Jomarie LongsSaramma Ardean Melroy, MD      . mirtazapine (REMERON) tablet 15 mg  15 mg Oral QHS Jomarie LongsSaramma Adriene Knipfer, MD   15 mg at 05/19/14 2058  . risperiDONE (RISPERDAL) tablet 3 mg  3 mg Oral BID Jomarie LongsSaramma Francella Barnett, MD   3 mg at 05/20/14 0751    Lab Results: No results found for this or any previous visit (from the past 48 hour(s)).  Physical Findings: AIMS: Facial and Oral Movements Muscles of Facial Expression: None, normal Lips and Perioral Area: None, normal Jaw: None, normal Tongue: None, normal,Extremity Movements Upper (arms, wrists, hands, fingers): None, normal Lower (legs, knees, ankles, toes): None, normal, Trunk Movements Neck, shoulders, hips: None, normal, Overall Severity Severity of abnormal movements (highest score from questions above): None, normal Incapacitation due to abnormal  movements: None, normal Patient's awareness of abnormal movements (rate only patient's report): No Awareness, Dental Status Current problems with teeth and/or dentures?: No Does patient usually wear dentures?: No  CIWA:  CIWA-Ar Total: 3 COWS:  COWS Total Score: 2  Treatment Plan Summary: Daily contact with patient to assess and evaluate symptoms and progress in treatment Medication management  Plan: Patient is a [age] y old CF who is divorced ,who has a hx of schizoaffective disorder ,who presented with SI with a plan to cut her wrist. [facility] ,her ACTT was working on placement. However pt reports sudden worsening depression when she felt suicidal ,and was admitted again. Pt however currently reports improvement in her mood sx.  Will continue to titrate her Depakote to reach therapeutic level. Depakote level on 05/23/2014. Will continue Risperdal 3 mg po bid. Will continue Cogentin 0.5 mg po bid. Will continue Remeron 15 mg po qhs. Will continue Gabapentin 200 mg po tid for anxiety ,mood lability. Will make prn medications available for agitation /anxiety.  CSW will work on disposition. Reviewed labs . Patient awaiting placement ,CSW will continue to work on it.     Medical Decision Making Problem Points:  Established problem, stable/improving (1) and Review of last therapy session (1) Data Points:  Order Aims Assessment (2) Review or order clinical lab tests (1) Review of medication regiment & side effects (2)  I certify that inpatient services furnished can reasonably be expected to improve the patient's condition.   Meshulem Onorato MD 05/20/2014, 1:21 PM

## 2014-05-20 NOTE — Progress Notes (Signed)
Patient ID: [name], female   DOB: 1960/09/30, 53 y.o.   MRN: [id number]  D: Pt. Denies SI/HI and A/V Hallucinations to this Clinical research associatewriter. Patient does not report any pain or discomfort at this time. Patient reports sleeping fair last night, good appetite, good concentration, and normal energy level.  A: Support and encouragement provided to the patient. Patient's affect appears brighter and patient is more willing to speak with Clinical research associatewriter. Scheduled medications administered to patient per physician's orders.  R: Patient is receptive and cooperative with Clinical research associatewriter. Patient is seen in the milieu at times and is attending groups. Q15 minute checks are maintained for safety.

## 2014-05-20 NOTE — Progress Notes (Signed)
Nutrition Education Note  RD consulted for nutrition education regarding HgbA1c of 5.8.   Pt admitted with schizoaffective disorder, depression.  Lab Results  Component Value Date   HGBA1C 5.8* 05/06/2014   Pt reports good appetite and PO intake, denies any weight loss. Pt was eating 2 cups of ice cream during visit. Pt reports drinking a lot of caffeine but denies eating a lot of sugary foods.   Pt has order for Ensure supplements and pt says she is drinking them.   Discussed different food groups and their effects on blood sugar, emphasizing carbohydrate-containing foods. Discussed with pt the importance of eating 3 meals a day with snacks, emphasizing protein consumption. Discussed the importance of good nutrition for mental health and aiding in depression and anxiety.   Expect poor compliance.  Body mass index is 34.38 kg/(m^2). Pt meets criteria for obesity based on current BMI.  Current diet order is regular and pt is also offered choice of unit snacks mid-morning and mid-afternoon.  Labs and medications reviewed: LDL 116 Remeron, Depakote  No further nutrition interventions warranted at this time. RD contact information provided. If additional nutrition issues arise, please re-consult RD.  Tilda FrancoLindsey Hanna Aultman, MS, RD, LDN Pager: [phone] After Hours Pager: [phone]

## 2014-05-20 NOTE — [facility] Group Notes (Signed)
[facility] Group Notes:  (Nursing/MHT/Case Management/Adjunct)  Date:  05/20/2014  Time:  11:58 AM  Type of Therapy:  Nurse Education  Participation Level:  Minimal  Participation Quality:  Resistant  Affect:  Anxious  Cognitive:  Alert  Insight:  Limited  Engagement in Group:  Resistant  Modes of Intervention:  Discussion and Education  Summary of Progress/Problems: This group focused on support systems and how patient's view their strengths. Patient was able to identify that she is good with animals and her strength is trustworthiness.  Marzetta BoardDopson, Lyndel Dancel E 05/20/2014, 11:58 AM

## 2014-05-20 NOTE — Progress Notes (Signed)
Pt attended group. Pt stated that she had an ok day. Pt also stated that she got some disappointing news today, she thought she was going home and the doctor told her she would leave on Monday. Pt stated she has learned coping skills such as breathing techniques while she was here in the hospital.

## 2014-05-21 NOTE — [facility] Group Notes (Signed)
Adult Psychoeducational Group Note  Date:  05/21/2014 Time:  9:52 PM  Group Topic/Focus:  Wrap-Up Group:   The focus of this group is to help patients review their daily goal of treatment and discuss progress on daily workbooks.  Participation Level:  Minimal  Participation Quality:  Appropriate  Affect:  Flat  Cognitive:  Appropriate  Insight: Good  Engagement in Group:  Limited  Modes of Intervention:  Discussion  Additional Comments:  [name] stated that her day was okay.  She said she getting closer to going home.  Her goal was to go to all groups.  She [facility].  [name], [name] 05/21/2014, 9:52 PM

## 2014-05-21 NOTE — Progress Notes (Signed)
D: Pt denies SI/HI/AVh. Pt is pleasant and cooperative. Pt stated she was happy that she will be going to group home when she leaves.   A: Pt was offered support and encouragement. Pt was given scheduled medications. Pt was encourage to attend groups. Q 15 minute checks were done for safety.   R:Pt attends groups and interacts well with peers and staff. Pt is taking medication. Pt has no complaints at this time.Pt receptive to treatment and safety maintained on unit.

## 2014-05-21 NOTE — Plan of Care (Signed)
Problem: Alteration in mood Goal: STG-Patient is able to discuss feelings and issues (Patient is able to discuss feelings and issues leading to depression)  Outcome: Progressing Pt is able to voice her feelings in group today.  She rated all her depression, hopelessness and anxiety a 1 on her self inventory.  She stated,"I feel my medications are helping me" she did voice understanding.

## 2014-05-21 NOTE — Plan of Care (Signed)
Problem: Ineffective individual coping Goal: LTG: Patient will report a decrease in negative feelings Outcome: Progressing Pt denies SI and stated she was feeling better today after finding out she was going to the group home  Problem: Alteration in mood Goal: LTG-Patient reports reduction in suicidal thoughts (Patient reports reduction in suicidal thoughts and is able to verbalize a safety plan for whenever patient is feeling suicidal)  Outcome: Progressing Pt denies SI at this time

## 2014-05-21 NOTE — Progress Notes (Signed)
Pt attended music therapy group this morning with nursing students. Pt participate in group.  

## 2014-05-21 NOTE — [facility] Group Notes (Signed)
[facility]   05/21/2014 10:30 AM  Participation Quality:  Engaged  Mood/Affect:  Flat  Depression Rating:  denies  Anxiety Rating:  denies  Thoughts of Suicide:  No Will you contract for safety?   NA  Current AVH:  No  Plan for Discharge/Comments:  [name]'s [facility] on Monday  Transportation Means: see above  Supports: ACT team  [place], Thereasa Distanceodney B

## 2014-05-21 NOTE — [facility] Group Notes (Signed)
[facility] LCSW Group Therapy  05/21/2014  1:05 PM  Type of Therapy:  Group therapy  Participation Level:  Active  Participation Quality:  Attentive  Affect:  Flat  Cognitive:  Oriented  Insight:  Limited  Engagement in Therapy:  Limited  Modes of Intervention:  Discussion, Socialization  Summary of Progress/Problems:  Chaplain was here to lead a group on themes of hope and courage. Sheralyn Boatmanoni sat quietly throughout.  She identified her ACT team as her main support, and how she experiences caring.  Daryel Geraldorth, Gabriela Irigoyen B 05/21/2014 3:02 PM

## 2014-05-21 NOTE — Plan of Care (Signed)
Problem: Alteration in mood Goal: STG-Patient is able to discuss feelings and issues (Patient is able to discuss feelings and issues leading to depression)  Outcome: Not Progressing Pt isolative most of the evening only coming over for medication and back to bed.

## 2014-05-21 NOTE — Progress Notes (Signed)
Patient ID: [name], female   DOB: August 09, 1960, 53 y.o.   MRN: [id number] PER STATE REGULATIONS 482.30  THIS CHART WAS REVIEWED FOR MEDICAL NECESSITY WITH RESPECT TO THE PATIENT'S ADMISSION/ DURATION OF STAY.  NEXT REVIEW DATE:  05/25/2014 [name] Eda Magnussen, RN, BSN CASE MANAGER

## 2014-05-21 NOTE — Progress Notes (Signed)
[facility]  05/21/2014 8:40 AM [name]  MRN:  [id number] Subjective: Patient states ,'I am fine" Objective: Patient seen and chart reviewed. Pt continues to be compliant on medications. Patient reports improvement in her depression, mood lability but continues to be guarded. Pt is unable to talk about her psychosocial triggers at this point. Denies any new complaints today. Patient reports sleep as fair and appetite as good. During her last admission she was not taking care of her ADLs ,but that has changed . She appears fairly groomed and seems to take care of self. Pt always minimizes her symptoms after admission ,but soon after discharge ended up coming back to the ED with SI.  Will continue to work on her medications. Patient denies SI/HI/AH/VH. Patient is compliant on medications. Patient denies any side effects.    Diagnosis:   DSM5:   Primary Psychiatric Diagnosis:  Schizoaffective disorder,depressive type ,multiple episodes ,currently in acute episode    Non Psychiatric Diagnosis:  GERD  Hyperlipidemia        Total Time spent with patient: 30 minutes   ADL's:  Intact  Sleep: Fair  Appetite:  Good   Psychiatric Specialty Exam: Physical Exam  ROS  Blood pressure 94/71, pulse 79, temperature 98.2 F (36.8 C), temperature source Oral, resp. rate 18, height 5\' 2"  (1.575 m), weight 85.276 kg (188 lb), last menstrual period 07/23/2004.Body mass index is 34.38 kg/(m^2).  General Appearance: Casual  Eye Contact::  Fair  Speech:  Clear and Coherent  Volume:  Normal  Mood:  Anxious improving  Affect:  Congruent  Thought Process:  Coherent  Orientation:  Full (Time, Place, and Person)  Thought Content:  WDL  Suicidal Thoughts:  No  Homicidal Thoughts:  No  Memory:  Immediate;   Fair Recent;   Fair Remote;   Fair  Judgement:  Impaired  Insight:  Fair  Psychomotor Activity:  Normal  Concentration:  Fair  Recall:  [employer]  Fund of Knowledge:Fair   Language: Good  Akathisia:  No  Handed:  Right  AIMS (if indicated):     Assets:  Communication Skills Desire for Improvement  Sleep:  Number of Hours: 6.75   Musculoskeletal: Strength & Muscle Tone: within normal limits Gait & Station: normal Patient leans: N/A  Current Medications: Current Facility-Administered Medications  Medication Dose Route Frequency Provider Last Rate Last Dose  . acetaminophen (TYLENOL) tablet 650 mg  650 mg Oral Q6H PRN Jomarie LongsSaramma Nevin Kozuch, MD      . alum & mag hydroxide-simeth (MAALOX/MYLANTA) 200-200-20 MG/5ML suspension 30 mL  30 mL Oral Q4H PRN Jomarie LongsSaramma Harles Evetts, MD      . benztropine (COGENTIN) tablet 0.5 mg  0.5 mg Oral BID Jomarie LongsSaramma Janeal Abadi, MD   0.5 mg at 05/20/14 1709  . haloperidol (HALDOL) tablet 5 mg  5 mg Oral Q6H PRN Jomarie LongsSaramma Psalms Olarte, MD       And  . benztropine (COGENTIN) tablet 1 mg  1 mg Oral Q6H PRN Saory Carriero, MD      . divalproex (DEPAKOTE ER) 24 hr tablet 750 mg  750 mg Oral QPM Kaho Selle, MD   750 mg at 05/20/14 1709  . feeding supplement (ENSURE COMPLETE) (ENSURE COMPLETE) liquid 237 mL  237 mL Oral BID BM Lecil Tapp, MD   237 mL at 05/20/14 1400  . gabapentin (NEURONTIN) capsule 200 mg  200 mg Oral TID Jomarie LongsSaramma Diera Wirkkala, MD   200 mg at 05/20/14 1710  . hydrOXYzine (ATARAX/VISTARIL) tablet 25 mg  25  mg Oral TID PRN Jomarie LongsSaramma Turki Tapanes, MD      . magnesium hydroxide (MILK OF MAGNESIA) suspension 30 mL  30 mL Oral Daily PRN Jomarie LongsSaramma Aalyah Mansouri, MD      . mirtazapine (REMERON) tablet 15 mg  15 mg Oral QHS Jomarie LongsSaramma Keoshia Steinmetz, MD   15 mg at 05/20/14 2119  . risperiDONE (RISPERDAL) tablet 3 mg  3 mg Oral BID Jomarie LongsSaramma Kashif Pooler, MD   3 mg at 05/20/14 1709    Lab Results: No results found for this or any previous visit (from the past 48 hour(s)).  Physical Findings: AIMS: Facial and Oral Movements Muscles of Facial Expression: None, normal Lips and Perioral Area: None, normal Jaw: None, normal Tongue: None, normal,Extremity Movements Upper (arms, wrists,  hands, fingers): None, normal Lower (legs, knees, ankles, toes): None, normal, Trunk Movements Neck, shoulders, hips: None, normal, Overall Severity Severity of abnormal movements (highest score from questions above): None, normal Incapacitation due to abnormal movements: None, normal Patient's awareness of abnormal movements (rate only patient's report): No Awareness, Dental Status Current problems with teeth and/or dentures?: No Does patient usually wear dentures?: No  CIWA:  CIWA-Ar Total: 3 COWS:  COWS Total Score: 2  Treatment Plan Summary: Daily contact with patient to assess and evaluate symptoms and progress in treatment Medication management  Plan: Patient is a [age] y old CF who is divorced ,who has a hx of schizoaffective disorder ,who presented with SI with a plan to cut her wrist. [facility] ,her ACTT was working on placement. However pt reports sudden worsening depression when she felt suicidal ,and was admitted again. Pt however currently reports improvement in her mood sx.  Will continue to titrate her Depakote to reach therapeutic level. Depakote level on 05/23/2014. Will continue Risperdal 3 mg po bid. Will continue Cogentin 0.5 mg po bid. Will continue Remeron 15 mg po qhs. Will continue Gabapentin 200 mg po tid for anxiety ,mood lability. Will make prn medications available for agitation /anxiety.  CSW will work on disposition. Reviewed labs . Patient awaiting placement ,CSW will continue to work on it.     Medical Decision Making Problem Points:  Established problem, stable/improving (1) and Review of last therapy session (1) Data Points:  Order Aims Assessment (2) Review or order clinical lab tests (1) Review of medication regiment & side effects (2)  I certify that inpatient services furnished can reasonably be expected to improve the patient's condition.   Kash Davie MD 05/21/2014, 8:40 AM

## 2014-05-21 NOTE — Progress Notes (Signed)
Pt has been up for all groups and activities on the unit. She rated all her depression, hopelessness and anxiety 1 on her self-inventory. She denies any S/H ideation or A/V/H. This morning patient was smelling of urine.  Discussed with pt about her hygiene and she stated,"I have my clothes in my locker but I want to wear the scrubs" explained that sometimes the scrubs will absorb odor and needs to be changed daily. She did voice understanding.

## 2014-05-22 NOTE — Progress Notes (Signed)
Patient ID: [name], female   DOB: [birth year], 53 y.o.   MR[id number] [facility]  05/22/2014 4:06 PM Rebecca Fueloni Heidler  MRN:  [id number] Subjective:"I am doing much better, ready to go to the group home"  Objective:  Patient was seen today participating in group.  Patient reports that he is doing well and is ready to move to a group home.  Patient states she has learned how to cope with stress by participating in group.  Patient states that she is eating well but her sleep did not improve much since she came to the hospital.  She stated that she has had issues with sleep most of her life.  Patient stated that she is no longer going to allow her relationship with her son bother her.  Patient states that her stress was not having a good relationship with her son.  Patient denies SI/HI/AVH and she has no plans.  Patient is looking forward to moving to her group home on Monday.  We will continue to offer her her medications and provide safety. Diagnosis:   DSM5: Primary Psychiatric Diagnosis:  Schizoaffective disorder,depressive type ,multiple episodes ,currently in acute episode   Non Psychiatric Diagnosis:  GERD  Hyperlipidemia   Total Time spent with patient: 30 minutes   ADL's:  Intact  Sleep: Fair  Appetite:  Good   Psychiatric Specialty Exam: Physical Exam  ROS  Blood pressure 105/65, pulse 94, temperature 98.7 F (37.1 C), temperature source Oral, resp. rate 16, height 5\' 2"  (1.575 m), weight 85.276 kg (188 lb), last menstrual period 07/23/2004.Body mass index is 34.38 kg/(m^2).  General Appearance: Casual  Eye Contact::  Fair  Speech:  Clear and Coherent  Volume:  Normal  Mood:  Anxious  Much improved, participates in group therapy  Affect:  Congruent  Thought Process:  Coherent  Orientation:  Full (Time, Place, and Person)  Thought Content:  WDL  Suicidal Thoughts:  No  Homicidal Thoughts:  No  Memory:  Immediate;   Fair Recent;   Fair Remote;   Fair   Judgement:  Impaired  Insight:  Fair  Psychomotor Activity:  Normal  Concentration:  Fair  Recall:  [employer]Fair  Fund of Knowledge:Fair  Language: Good  Akathisia:  No  Handed:  Right  AIMS (if indicated):     Assets:  Communication Skills Desire for Improvement  Sleep:  Number of Hours: 6.5   Musculoskeletal: Strength & Muscle Tone: within normal limits Gait & Station: normal Patient leans: N/A  Current Medications: Current Facility-Administered Medications  Medication Dose Route Frequency Provider Last Rate Last Dose  . acetaminophen (TYLENOL) tablet 650 mg  650 mg Oral Q6H PRN Jomarie LongsSaramma Eappen, MD      . alum & mag hydroxide-simeth (MAALOX/MYLANTA) 200-200-20 MG/5ML suspension 30 mL  30 mL Oral Q4H PRN Jomarie LongsSaramma Eappen, MD      . benztropine (COGENTIN) tablet 0.5 mg  0.5 mg Oral BID Jomarie LongsSaramma Eappen, MD   0.5 mg at 05/22/14 0808  . haloperidol (HALDOL) tablet 5 mg  5 mg Oral Q6H PRN Jomarie LongsSaramma Eappen, MD       And  . benztropine (COGENTIN) tablet 1 mg  1 mg Oral Q6H PRN Saramma Eappen, MD      . divalproex (DEPAKOTE ER) 24 hr tablet 750 mg  750 mg Oral QPM Saramma Eappen, MD   750 mg at 05/21/14 1659  . feeding supplement (ENSURE COMPLETE) (ENSURE COMPLETE) liquid 237 mL  237 mL Oral BID BM Saramma Eappen,  MD   237 mL at 05/22/14 1336  . gabapentin (NEURONTIN) capsule 200 mg  200 mg Oral TID Jomarie LongsSaramma Eappen, MD   200 mg at 05/22/14 1242  . hydrOXYzine (ATARAX/VISTARIL) tablet 25 mg  25 mg Oral TID PRN Jomarie LongsSaramma Eappen, MD      . magnesium hydroxide (MILK OF MAGNESIA) suspension 30 mL  30 mL Oral Daily PRN Jomarie LongsSaramma Eappen, MD      . mirtazapine (REMERON) tablet 15 mg  15 mg Oral QHS Jomarie LongsSaramma Eappen, MD   15 mg at 05/21/14 2148  . risperiDONE (RISPERDAL) tablet 3 mg  3 mg Oral BID Jomarie LongsSaramma Eappen, MD   3 mg at 05/22/14 0809    Lab Results: No results found for this or any previous visit (from the past 48 hour(s)).  Physical Findings: AIMS: Facial and Oral Movements Muscles of Facial Expression:  None, normal Lips and Perioral Area: None, normal Jaw: None, normal Tongue: None, normal,Extremity Movements Upper (arms, wrists, hands, fingers): None, normal Lower (legs, knees, ankles, toes): None, normal, Trunk Movements Neck, shoulders, hips: None, normal, Overall Severity Severity of abnormal movements (highest score from questions above): None, normal Incapacitation due to abnormal movements: None, normal Patient's awareness of abnormal movements (rate only patient's report): No Awareness, Dental Status Current problems with teeth and/or dentures?: No Does patient usually wear dentures?: No  CIWA:  CIWA-Ar Total: 3 COWS:  COWS Total Score: 2  Treatment Plan Summary: Daily contact with patient to assess and evaluate symptoms and progress in treatment Medication management  Plan:  Continue with plan of care Continue crisis management Encourage to participate in group and individual sessions Continue medication management/ and review as needed  Continue taking your Risperdal 3 mg po bid.  Cogentin 0.5 mg po bid.  Remeron 15 mg po qhs.  Gabapentin 200 mg po tid for anxiety ,mood lability. Discharge  Plan in progress Address health issues /V/S as needed   Medical Decision Making Problem Points:  Established problem, stable/improving (1) and Review of last therapy session (1) Data Points:  Order Aims Assessment (2) Review or order clinical lab tests (1) Review of medication regiment & side effects (2)  I certify that inpatient services furnished can reasonably be expected to improve the patient's condition.   Dahlia ByesONUOHA, JOSEPHINE, C  PMHNP-BC 05/22/2014, 4:06 PM  Agree with Assessment and Plan

## 2014-05-22 NOTE — Progress Notes (Signed)
Adult Psychoeducational Group Note  Date:  05/22/2014 Time:  9:19 PM  Group Topic/Focus:  Wrap-Up Group:   The focus of this group is to help patients review their daily goal of treatment and discuss progress on daily workbooks.  Participation Level:  Active  Participation Quality:  Appropriate  Affect:  Appropriate  Cognitive:  Appropriate  Insight: Appropriate  Engagement in Group:  Engaged  Modes of Intervention:  Discussion  Additional Comments:  The patient expressed that she had a good day.The patient also said that she feels the meds are working better for her.  Octavio Mannshigpen, Geoff Dacanay Lee 05/22/2014, 9:19 PM

## 2014-05-22 NOTE — [facility] Group Notes (Signed)
[facility] Group Notes:  (Nursing/MHT/Case Management/Adjunct)  Date:  05/22/2014  Time:  5:45 PM  Type of Therapy:  Nurse Education  Participation Level:  Did Not Attend  Participation Quality:  did not attend  Affect:  did not attend  Cognitive:  did not attend  Insight:  None  Engagement in Group:  did not attend  Modes of Intervention:  did not attend  Summary of Progress/Problems:pt was in bed asleep.   Jule SerKent, [name] 05/22/2014, 5:45 PM

## 2014-05-22 NOTE — Plan of Care (Signed)
Problem: Ineffective individual coping Goal: LTG: Patient will report a decrease in negative feelings Outcome: Completed/Met Date Met:  05/22/14 Pt denied SI/HI/AVH, stated she felt better than she has been feeling     Problem: Alteration in mood Goal: LTG-Patient reports reduction in suicidal thoughts (Patient reports reduction in suicidal thoughts and is able to verbalize a safety plan for whenever patient is feeling suicidal)  Outcome: Completed/Met Date Met:  05/22/14 Pt has denied SI/HI/AVH past 3 days

## 2014-05-22 NOTE — Progress Notes (Signed)
Pt has been up and active in the milieu. She did not go to the 2 morning groups for she was asleep in her bed. She rated all depression hopelessness and anxiety a 1 on her self-inventory. She denied any S/H ideation or A/V/H. She voiced no concerns today.

## 2014-05-22 NOTE — [facility] Group Notes (Signed)
[facility] LCSW Group Therapy  05/22/2014 3:07 PM  Type of Therapy:  Group Therapy  Participation Level:  None  Participation Quality:  Appropriate  Affect:  Flat  Cognitive:  Alert  Insight:  None  Engagement in Therapy:  None  Modes of Intervention:  Education  Summary of Progress/Problems: Group processing today was about identifying key lessons learned in working through Child psychotherapistmental health/wellness. With these examples processed from the entire group, using the lessons learned to address challenges that participant needs to overcome. Patient did share her name but did not participate in group. Patient was present and attentive but did not offer any feedback.  Beverly Sessionsywan J Lindsey MSW, LCSW  Clide DalesHarrill, Catherine Campbell 05/22/2014, 3:07 PM

## 2014-05-22 NOTE — [facility] Group Notes (Signed)
[facility] Group Notes:  (Nursing/MHT/Case Management/Adjunct)  Date:  05/22/2014  Time:  5:48 PM  Type of Therapy:  Nurse Education  Participation Level:  Did Not Attend  Participation Quality:  did not attend  Affect:  did not atend  Cognitive:  did not attend  Insight:  None  Engagement in Group:  did not attend  Modes of Intervention:  did not attend  Summary of Progress/Problems:pt asleep in bed   Jule SerKent, Dewey Viens Gail 05/22/2014, 5:48 PM

## 2014-05-22 NOTE — Progress Notes (Signed)
D: Pt denies SI/HI/AVH. Pt is pleasant and cooperative. Pt said she was feeling better, she said she was just ready and excited to go to the group home.   A: Pt was offered support and encouragement. Pt was given scheduled medications. Pt was encourage to attend groups. Q 15 minute checks were done for safety.   R:Pt attends groups and interacts well with peers and staff. Pt is taking medication. Pt has no complaints at this time.Pt receptive to treatment and safety maintained on unit.

## 2014-05-23 DIAGNOSIS — F251 Schizoaffective disorder, depressive type: Secondary | ICD-10-CM | POA: Insufficient documentation

## 2014-05-23 LAB — VALPROIC ACID LEVEL: Valproic Acid Lvl: 34.5 ug/mL — ABNORMAL LOW (ref 50.0–100.0)

## 2014-05-23 NOTE — Progress Notes (Signed)
Patient ID: [name], female   DOB: 1961-02-10, 53 y.o.   MR[facility]: [id number] [facility]  05/23/2014 2:18 PM Rebecca Fueloni Desanto  MRN:  [id number] Subjective:"I am doing much better, ready to go to the group home"  Objective: Patient maintains that she is doing well.  She, however is noted with flat affect.  She described her mood as normal and stated again today that she is ready to move into a group home.  She participates in groups and is compliant with her medications.  Patient states that she will apply everything she has learned here in the group home so that she will get along with her peers and prevent relapse.  Patient reported good sleep and appetite.  She denies SI/HI/AVH.  Plan is for discharge tomorrow and patient will be moving to a group home. Diagnosis:   DSM5: Primary Psychiatric Diagnosis:  Schizoaffective disorder,depressive type ,multiple episodes ,currently in acute episode   Non Psychiatric Diagnosis:  GERD  Hyperlipidemia   Total Time spent with patient: 30 minutes   ADL's:  Intact  Sleep: Fair  Appetite:  Good   Psychiatric Specialty Exam: Physical Exam  ROS  Blood pressure 96/60, pulse 90, temperature 98.5 F (36.9 C), temperature source Oral, resp. rate 18, height 5\' 2"  (1.575 m), weight 85.276 kg (188 lb), last menstrual period 07/23/2004.Body mass index is 34.38 kg/(m^2).  General Appearance: Casual  Eye Contact::  Fair  Speech:  Clear and Coherent  Volume:  Normal  Mood:  Anxious  Much improved, participates in group therapy  Affect:  Congruent  Thought Process:  Coherent  Orientation:  Full (Time, Place, and Person)  Thought Content:  WDL  Suicidal Thoughts:  No  Homicidal Thoughts:  No  Memory:  Immediate;   Fair Recent;   Fair Remote;   Fair  Judgement:  Impaired  Insight:  Fair  Psychomotor Activity:  Normal  Concentration:  Fair  Recall:  [employer]Fair  Fund of Knowledge:Fair  Language: Good  Akathisia:  No  Handed:  Right  AIMS (if  indicated):     Assets:  Communication Skills Desire for Improvement  Sleep:  Number of Hours: 7.75   Musculoskeletal: Strength & Muscle Tone: within normal limits Gait & Station: normal Patient leans: N/A  Current Medications: Current Facility-Administered Medications  Medication Dose Route Frequency Provider Last Rate Last Dose  . acetaminophen (TYLENOL) tablet 650 mg  650 mg Oral Q6H PRN Jomarie LongsSaramma Eappen, MD      . alum & mag hydroxide-simeth (MAALOX/MYLANTA) 200-200-20 MG/5ML suspension 30 mL  30 mL Oral Q4H PRN Jomarie LongsSaramma Eappen, MD      . benztropine (COGENTIN) tablet 0.5 mg  0.5 mg Oral BID Jomarie LongsSaramma Eappen, MD   0.5 mg at 05/23/14 0742  . haloperidol (HALDOL) tablet 5 mg  5 mg Oral Q6H PRN Jomarie LongsSaramma Eappen, MD       And  . benztropine (COGENTIN) tablet 1 mg  1 mg Oral Q6H PRN Saramma Eappen, MD      . divalproex (DEPAKOTE ER) 24 hr tablet 750 mg  750 mg Oral QPM Saramma Eappen, MD   750 mg at 05/22/14 1723  . feeding supplement (ENSURE COMPLETE) (ENSURE COMPLETE) liquid 237 mL  237 mL Oral BID BM Saramma Eappen, MD   237 mL at 05/23/14 1410  . gabapentin (NEURONTIN) capsule 200 mg  200 mg Oral TID Jomarie LongsSaramma Eappen, MD   200 mg at 05/23/14 1141  . hydrOXYzine (ATARAX/VISTARIL) tablet 25 mg  25 mg Oral  TID PRN Jomarie LongsSaramma Eappen, MD      . magnesium hydroxide (MILK OF MAGNESIA) suspension 30 mL  30 mL Oral Daily PRN Jomarie LongsSaramma Eappen, MD      . mirtazapine (REMERON) tablet 15 mg  15 mg Oral QHS Jomarie LongsSaramma Eappen, MD   15 mg at 05/22/14 2113  . risperiDONE (RISPERDAL) tablet 3 mg  3 mg Oral BID Jomarie LongsSaramma Eappen, MD   3 mg at 05/23/14 [id number]    Lab Results:  Results for orders placed or performed during the hospital encounter of 05/18/14 (from the past 48 hour(s))  Valproic acid level     Status: Abnormal   Collection Time: 05/23/14  6:30 AM  Result Value Ref Range   Valproic Acid Lvl 34.5 (L) 50.0 - 100.0 ug/mL    Comment[facility]    Physical Findings: AIMS: Facial and Oral  Movements Muscles of Facial Expression: None, normal Lips and Perioral Area: None, normal Jaw: None, normal Tongue: None, normal,Extremity Movements Upper (arms, wrists, hands, fingers): None, normal Lower (legs, knees, ankles, toes): None, normal, Trunk Movements Neck, shoulders, hips: None, normal, Overall Severity Severity of abnormal movements (highest score from questions above): None, normal Incapacitation due to abnormal movements: None, normal Patient's awareness of abnormal movements (rate only patient's report): No Awareness, Dental Status Current problems with teeth and/or dentures?: No Does patient usually wear dentures?: No  CIWA:  CIWA-Ar Total: 3 COWS:  COWS Total Score: 2  Treatment Plan Summary: Daily contact with patient to assess and evaluate symptoms and progress in treatment Medication management  Plan:  Continue with plan of care Continue crisis management Encourage to participate in group and individual sessions Continue medication management/ and review as needed  Continue taking your Risperdal 3 mg po bid.  Cogentin 0.5 mg po bid.  Remeron 15 mg po qhs.  Gabapentin 200 mg po tid for anxiety ,mood lability. Discharge  Plan in progress Address health issues /V/S as needed   Medical Decision Making Problem Points:  Established problem, stable/improving (1) and Review of last therapy session (1) Data Points:  Order Aims Assessment (2) Review or order clinical lab tests (1) Review of medication regiment & side effects (2)  I certify that inpatient services furnished can reasonably be expected to improve the patient's condition.   Dahlia ByesONUOHA, JOSEPHINE, C  PMHNP-BC 05/23/2014, 2:18 PM

## 2014-05-23 NOTE — [facility] Group Notes (Signed)
[facility] Group Notes:  (Nursing/MHT/Case Management/Adjunct)  Date:  05/23/2014  Time:  1:21 PM  Type of Therapy:  Psychoeducational Skills-Pt Self Inventory Group   Participation Level:  Active  Participation Quality:  Appropriate  Affect:  Appropriate  Cognitive:  Appropriate  Insight:  Appropriate  Engagement in Group:  Engaged  Modes of Intervention:  Discussion  Summary of Progress/Problems: Pt did attend self inventory group.       Jacquelyne BalintForrest, Alanee Ting Shanta 05/23/2014, 1:21 PM

## 2014-05-23 NOTE — Plan of Care (Signed)
Problem: Ineffective individual coping Goal: STG-Increase in ability to manage activities of daily living Outcome: Progressing     

## 2014-05-23 NOTE — [facility] Group Notes (Signed)
[facility] Group Notes:  (Nursing/MHT/Case Management/Adjunct)  Date:  05/23/2014  Time:  1:22 PM    Type of Therapy:  Psychoeducational Skills-Healthy Support Systems   Participation Level:  Active  Participation Quality:  Appropriate  Affect:  Appropriate  Cognitive:  Appropriate  Insight:  Appropriate  Engagement in Group:  Engaged  Modes of Intervention:  Discussion  Summary of Progress/Problems: Pt did attend healthy support systems group.   Jacquelyne BalintForrest, Olaf Mesa Shanta 05/23/2014, 1:22 PM

## 2014-05-23 NOTE — Progress Notes (Signed)
[facility] Group Notes:  (Nursing/MHT/Case Management/Adjunct)  Date:  05/23/2014  Time:  9:52 PM  Type of Therapy:  Psychoeducational Skills  Participation Level:  Active  Participation Quality:  Appropriate  Affect:  Flat  Cognitive:  Appropriate  Insight:  Improving  Engagement in Group:  Improving  Modes of Intervention:  Education  Summary of Progress/Problems: The patient shared with the group that she had a good day overall and smiled quite a bit when she mentioned that she will be discharged tomorrow. She states that attending the groups helped her today. As a theme for the day, her support system will consist of the ACT Team and members of her group home. She anticipates that she will be discharged to the same group home following her admission.   Hazle CocaGOODMAN, Tayana Shankle S 05/23/2014, 9:52 PM

## 2014-05-23 NOTE — Progress Notes (Signed)
D: Pt has appropriate affect and anxious mood.  Pt reports she is leaving tomorrow and that she will be going to [facility] group home.  Pt denies SI/HI, denies hallucinations.  She interacts appropriately with peers and staff.  Attended evening group.   A: Met with pt 1:1 and offered support and encouragement.  Safety maintained.  Medications administered per order.   R: Pt is compliant with medications.  She is in no distress and verbally contracts for safety.  Will continue to monitor and assess for safety.

## 2014-05-23 NOTE — Plan of Care (Signed)
Problem: Diagnosis: Increased Risk For Suicide Attempt Goal: LTG-Patient Will Report Improved Mood and Deny Suicidal LTG (by discharge) Patient will report improved mood and deny suicidal ideation.  Outcome: Progressing     

## 2014-05-23 NOTE — [facility] Group Notes (Signed)
[facility] LCSW Group Therapy 05/23/2014   Type of Therapy: Group Therapy- Feelings Around Discharge & Establishing a Supportive Framework  Participation Level: Active   Participation Quality:  Reserved  Affect:  Appropriate   Cognitive: Alert and Oriented   Insight:  Developing/Improving   Engagement in Therapy: Developing/Improving and Engaged   Modes of Intervention: Clarification, Confrontation, Discussion, Education, Exploration, Limit-setting, Orientation, Problem-solving, Rapport Building, Dance movement psychotherapisteality Testing, Socialization and Support   Description of Group:   What is a supportive framework? What does it look like feel like and how do I discern it from and unhealthy non-supportive network? Learn how to cope when supports are not helpful and don't support you. Discuss what to do when your family/friends are not supportive. Pt participated in group discussion when prompted directly. Pt identified her ACTT as a positive support because of their positive attitudes. Pt contrasted this with a relationship that has a "negative attitude." Pt reported that she keeps an open line of communication with her ACTT support services.    Therapeutic Modalities:   Cognitive Behavioral Therapy Person-Centered Therapy Motivational Interviewing   Chad CordialLauren Carter, LCSWA 05/23/2014 2:53 PM

## 2014-05-23 NOTE — Progress Notes (Signed)
Patient ID: [name], female   DOB: 01/08/1961, 53 y.o.   MRN: [id number]  D: 53 year old female, voluntarily admitted, has a dull and flat affect. She has limited interacions with her roommate and other patients. Per self inventory, pt rates depression 1, hopelessness 1 and anxiety 1. Pt reports having no daily goal. Pt reports a good appetite, normal energy and fair sleep last night.   A: Medications given per MD orders. Pt emotionally supported. Writer checked in with pt periodically throughout the day about questions and concerns. Writer offered nutritional support to pt throughout the day.   R: Pt is cooperative but appears to be preoccupied. Pt enjoys the chocolate ensures. Pt remains safe with 15 minute and environmental checks. Pt currently denies SI/HI and AVH.    Aurora Maskwyman, Heela Heishman E, RN

## 2014-05-24 MED ORDER — DIVALPROEX SODIUM ER 250 MG PO TB24: 750.0000 mg | ORAL_TABLET | Freq: Every evening | ORAL | Status: AC

## 2014-05-24 MED ORDER — RISPERIDONE 3 MG PO TABS: 3.0000 mg | ORAL_TABLET | Freq: Two times a day (BID) | ORAL | Status: AC

## 2014-05-24 MED ORDER — GABAPENTIN 100 MG PO CAPS: 200.0000 mg | ORAL_CAPSULE | Freq: Three times a day (TID) | ORAL | Status: AC

## 2014-05-24 MED ORDER — MIRTAZAPINE 15 MG PO TABS: 15.0000 mg | ORAL_TABLET | Freq: Every day | ORAL | Status: AC

## 2014-05-24 MED ORDER — BENZTROPINE MESYLATE 0.5 MG PO TABS
0.5000 mg | ORAL_TABLET | Freq: Two times a day (BID) | ORAL | Status: AC
Start: 2014-05-24 — End: ?

## 2014-05-24 NOTE — Progress Notes (Signed)
D: Pt denies SI/HI/AVH. Pt is pleasant and cooperative. Pt a little down today due to not leaving and not going to Group home.   A: Pt was offered support and encouragement. Pt was given scheduled medications. Pt was encourage to attend groups. Q 15 minute checks were done for safety.   R:Pt attends groups and interacts well with peers and staff. Pt is taking medication. Pt has no complaints at this time .Pt receptive to treatment and safety maintained on unit.
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
## 2014-05-24 NOTE — Tx Team (Signed)
  Interdisciplinary Treatment Plan Update   Date Reviewed:  05/24/2014  Time Reviewed:  10:41 AM  Progress in Treatment:   Attending groups: Yes Participating in groups: Yes Taking medication as prescribed: Yes  Tolerating medication: Yes Family/Significant other contact made: Yes  Patient understands diagnosis: Yes  Discussing patient identified problems/goals with staff: Yes  See initial care plan Medical problems stabilized or resolved: Yes Denies suicidal/homicidal ideation: Yes  In tx team Patient has not harmed self or others: Yes  For review of initial/current patient goals, please see plan of care.  Estimated Length of Stay:  D/C today  Reason for Continuation of Hospitalization:   New Problems/Goals identified:  N/A  Discharge Plan or Barriers:   [facility], follow up with [facility]  Additional Comments:  Attendees:  Signature: Ivin BootySarama Eappen, MD 05/24/2014 10:41 AM   Signature: Richelle Itood Eljay Lave, LCSW 05/24/2014 10:41 AM  Signature:  05/24/2014 10:41 AM  Signature: Lendell CapriceBrittany Guthrie, RN 05/24/2014 10:41 AM  Signature:  05/24/2014 10:41 AM  Signature:  05/24/2014 10:41 AM  Signature:   05/24/2014 10:41 AM  Signature:    Signature:    Signature:    Signature:    Signature:    Signature:      Scribe for Treatment Team:   Richelle Itood Diamante Truszkowski, LCSW  05/24/2014 10:41 AM

## 2014-05-24 NOTE — Plan of Care (Signed)
Problem: Ineffective individual coping Goal: STG: Patient will participate in after care plan See tx plan. Goal met. R North LCSW 05/24/2014 10:43 AM  Outcome: Progressing Pt involved in discharge planning and expresses desire to go to a group home.  Problem: Diagnosis: Increased Risk For Suicide Attempt Goal: STG-Patient Will Attend All Groups On The Unit Outcome: Progressing Pt attended all groups today.  Problem: Alteration in mood Goal: LTG-Pt's behavior demonstrates decreased signs of depression No signs nor symptoms of depression today. Goal met. R north LCSW 05/24/2014 10:43 AM  Outcome: Progressing Pt's mood is pleasant today.

## 2014-05-24 NOTE — Progress Notes (Signed)
[facility]  05/24/2014 4:13 PM [name]  MRN:  [id number] Subjective: Patient states ,'I do not want to stay here any more ". Objective: Patient seen and chart reviewed. Pt was to be discharged to ALF today, however after DC order was done ,at the end of the day ,the ALF had concerns about her medication copay and hence did not return phone calls/neither let us know if they were coming to pick her up or not. CSW made several phone calls to find out ,but no response. Discussed this with patient -patient is currently upset ,crying ,stating that "I do not want to stay here anymore".  Patient denies SI/HI/AH/VH.    Diagnosis:   DSM5:  Primary Psychiatric Diagnosis:  Schizoaffective disorder,depressive type ,multiple episodes ,currently in acute episode    Non Psychiatric Diagnosis:  GERD  Hyperlipidemia        Total Time spent with patient: 30 minutes   ADL's:  Intact  Sleep: Fair  Appetite:  Good   Psychiatric Specialty Exam: Physical Exam  ROS  Blood pressure 107/63, pulse 107, temperature 97.6 F (36.4 C), temperature source Oral, resp. rate 16, height 5\' 2"  (1.575 m), weight 85.276 kg (188 lb), last menstrual period 07/23/2004.Body mass index is 34.38 kg/(m^2).  General Appearance: Casual  Eye Contact::  Fair  Speech:  Clear and Coherent  Volume:  Normal  Mood:  Anxious upset about the change in DC plan.  Affect:  Tearful  Thought Process:  Coherent  Orientation:  Full (Time, Place, and Person)  Thought Content:  WDL  Suicidal Thoughts:  No  Homicidal Thoughts:  No  Memory:  Immediate;   Fair Recent;   Fair Remote;   Fair  Judgement:  Impaired  Insight:  Fair  Psychomotor Activity:  Normal  Concentration:  Fair  Recall:  [employer]Fair  Fund of Knowledge:Fair  Language: Good  Akathisia:  No  Handed:  Right  AIMS (if indicated):     Assets:  Communication Skills Desire for Improvement  Sleep:  Number of Hours: 6.5   Musculoskeletal: Strength &  Muscle Tone: within normal limits Gait & Station: normal Patient leans: N/A  Current Medications: Current Facility-Administered Medications  Medication Dose Route Frequency Provider Last Rate Last Dose  . acetaminophen (TYLENOL) tablet 650 mg  650 mg Oral Q6H PRN Jomarie LongsSaramma Lashondra Vaquerano, MD      . alum & mag hydroxide-simeth (MAALOX/MYLANTA) 200-200-20 MG/5ML suspension 30 mL  30 mL Oral Q4H PRN Jomarie LongsSaramma Meenakshi Sazama, MD      . benztropine (COGENTIN) tablet 0.5 mg  0.5 mg Oral BID Jomarie LongsSaramma Lorrane Mccay, MD   0.5 mg at 05/24/14 0807  . haloperidol (HALDOL) tablet 5 mg  5 mg Oral Q6H PRN Jomarie LongsSaramma Lundynn Cohoon, MD       And  . benztropine (COGENTIN) tablet 1 mg  1 mg Oral Q6H PRN Jaylan Duggar, MD      . divalproex (DEPAKOTE ER) 24 hr tablet 750 mg  750 mg Oral QPM Noelani Harbach, MD   750 mg at 05/23/14 1849  . feeding supplement (ENSURE COMPLETE) (ENSURE COMPLETE) liquid 237 mL  237 mL Oral BID BM Inmer Nix, MD   237 mL at 05/24/14 1428  . gabapentin (NEURONTIN) capsule 200 mg  200 mg Oral TID Jomarie LongsSaramma Assunta Pupo, MD   200 mg at 05/24/14 1154  . hydrOXYzine (ATARAX/VISTARIL) tablet 25 mg  25 mg Oral TID PRN Jomarie LongsSaramma Kayon Dozier, MD      . magnesium hydroxide (MILK OF MAGNESIA) suspension 30 mL  30 mL Oral Daily PRN Jomarie LongsSaramma Delories Mauri, MD      . mirtazapine (REMERON) tablet 15 mg  15 mg Oral QHS Jomarie LongsSaramma Banks Chaikin, MD   15 mg at 05/23/14 2102  . risperiDONE (RISPERDAL) tablet 3 mg  3 mg Oral BID Jomarie LongsSaramma Zoii Florer, MD   3 mg at 05/24/14 0807    Lab Results:  Results for orders placed or performed during the hospital encounter of 05/18/14 (from the past 48 hour(s))  Valproic acid level     Status: Abnormal   Collection Time: 05/23/14  6:30 AM  Result Value Ref Range   Valproic Acid Lvl 34.5 (L) 50.0 - 100.0 ug/mL    Comment: [facility]    Physical Findings: AIMS: Facial and Oral Movements Muscles of Facial Expression: None, normal Lips and Perioral Area: None, normal Jaw: None, normal Tongue: None,  normal,Extremity Movements Upper (arms, wrists, hands, fingers): None, normal Lower (legs, knees, ankles, toes): None, normal, Trunk Movements Neck, shoulders, hips: None, normal, Overall Severity Severity of abnormal movements (highest score from questions above): None, normal Incapacitation due to abnormal movements: None, normal Patient's awareness of abnormal movements (rate only patient's report): No Awareness, Dental Status Current problems with teeth and/or dentures?: No Does patient usually wear dentures?: No  CIWA:  CIWA-Ar Total: 3 COWS:  COWS Total Score: 2  Treatment Plan Summary: Daily contact with patient to assess and evaluate symptoms and progress in treatment Medication management  Plan: Patient is a [age] y old CF who is divorced ,who has a hx of schizoaffective disorder ,who presented with SI with a plan to cut her wrist. [facility] ,her ACTT was working on placement. However pt reported  sudden worsening depression when she felt suicidal ,and was admitted again.  Will DC discharge plan for today since pt is in need of placement. Will continue Depakote ER 750 mg po qpm. Depakote level - 05/23/14- 34.5 -however pt denies any mood swings ,will not make any changes. Will continue Risperdal 3 mg po bid. Will continue Cogentin 0.5 mg po bid. Will continue Remeron 15 mg po qhs. Will continue Gabapentin 200 mg po tid for anxiety ,mood lability. Will make prn medications available for agitation /anxiety.  CSW will work on disposition. Plan to contact ALF -to try to work out disposition as planned by tomorrow AM.      Medical Decision Making Problem Points:  Established problem, stable/improving (1) and Review of last therapy session (1) Data Points:  Order Aims Assessment (2) Review or order clinical lab tests (1) Review of medication regiment & side effects (2)  I certify that inpatient services furnished can reasonably be expected to improve  the patient's condition.   Novah Nessel MD 05/24/2014, 4:13 PM

## 2014-05-24 NOTE — Plan of Care (Signed)
Problem: Diagnosis: Increased Risk For Suicide Attempt Goal: STG-Patient Will Attend All Groups On The Unit Outcome: Progressing Pt attended evening group on 05/23/14.

## 2014-05-24 NOTE — Progress Notes (Signed)
D: Patient is alert and oriented. Pt states her mood is "fine" and her affect is pleasant. Pt reports her depression, hopelessness, and anxiety 1/10. Pt denies SI/HI and AVH at this time. Pt attending groups. A: Medications administered per providers orders (See MAR). 15 minute checks completed per protocol for pt safety. R: Pt receptive and cooperative to nursing interventions. Alena BillsGuthrie, Meoshia Billing A, RN

## 2014-05-24 NOTE — [facility] Suicide Risk Assessment (Signed)
[facility] INPATIENT:  Family/Significant Other Suicide Prevention Education  Suicide Prevention Education:  Education Completed; Narda AmberJeff McKay, ACT team lead, [phone] has been identified by the patient as the family member/significant other with whom the patient will be residing, and identified as the person(s) who will aid the patient in the event of a mental health crisis (suicidal ideations/suicide attempt).  With written consent from the patient, the family member/significant other has been provided the following suicide prevention education, prior to the and/or following the discharge of the patient.  The suicide prevention education provided includes the following:  Suicide risk factors  Suicide prevention and interventions  National Suicide Hotline telephone number  [facility] assessment telephone number  [facility] Emergency Assistance 911  [facility] and/or [facility] Crisis [facility] telephone number  Request made of family/significant other to:  Remove weapons (e.g., guns, rifles, knives), all items previously/currently identified as safety concern.    Remove drugs/medications (over-the-counter, prescriptions, illicit drugs), all items previously/currently identified as a safety concern.  The family member/significant other verbalizes understanding of the suicide prevention education information provided.  The family member/significant other agrees to remove the items of safety concern listed above.  Daryel Geraldorth, Monterrius Cardosa B 05/24/2014, 10:44 AM

## 2014-05-24 NOTE — [facility] Suicide Risk Assessment (Addendum)
   Demographic Factors:  Divorced or widowed, Caucasian and Low socioeconomic status  Total Time spent with patient: 45 minutes  Psychiatric Specialty Exam: Physical Exam  ROS  Blood pressure 107/63, pulse 107, temperature 97.6 F (36.4 C), temperature source Oral, resp. rate 16, height 5\' 2"  (1.575 m), weight 85.276 kg (188 lb), last menstrual period 07/23/2004.Body mass index is 34.38 kg/(m^2).  General Appearance: Casual  Eye Contact::  Fair  Speech:  Clear and Coherent  Volume:  Normal  Mood:  Euthymic  Affect:  Congruent  Thought Process:  Coherent  Orientation:  Full (Time, Place, and Person)  Thought Content:  WDL  Suicidal Thoughts:  No  Homicidal Thoughts:  No  Memory:  Immediate;   Fair Recent;   Fair Remote;   Fair  Judgement:  Fair  Insight:  Fair  Psychomotor Activity:  Normal  Concentration:  Fair  Recall:  [employer]Fair  Fund of Knowledge:Fair  Language: Good  Akathisia:  No  Handed:  Right  AIMS (if indicated):     Assets:  Communication Skills Desire for Improvement Housing Social Support  Sleep:  Number of Hours: 6.5    Musculoskeletal: Strength & Muscle Tone: within normal limits Gait & Station: normal Patient leans: N/A   Mental Status Per Nursing Assessment::   On Admission:  Suicidal ideation indicated by patient  Current Mental Status by Physician: Patient presented with SI with plan to cut self. Pt reported this as due to her thoughts about being lonely and her relational struggles with her son.Pt today appears to be very motivated to go to the new ALF. Pt denies any depression ,mood swings and denies SI/HI/AH/VH.  Loss Factors: Loss of significant relationship  Historical Factors: Prior suicide attempts and Impulsivity  Risk Reduction Factors:   Living with another person, especially a relative and Positive therapeutic relationship  Continued Clinical Symptoms:  Previous Psychiatric Diagnoses and Treatments Medical Diagnoses and  Treatments/Surgeries  Cognitive Features That Contribute To Risk:  Pt is elderly and at baseline is alert ,O X3.     Suicide Risk:  Minimal: No identifiable suicidal ideations.  Discharge Diagnoses:  DSM5:   Primary Psychiatric Diagnosis:  Schizoaffective disorder,depressive type ,multiple episodes ,currently in acute episode (RESOLVED -ACUTE EPISODE)   Non Psychiatric Diagnosis:  GERD  Hyperlipidemia    Past Medical History  Diagnosis Date  . Gastro - esophageal reflux disease   . Schizoaffective disorder   . Bipolar mood disorder   . Bipolar affective     complient with meds  . Depression   . Anxiety     Plan Of Care/Follow-up recommendations: Pt to be discharged to an ALF. Activity:  no restrictions Diet:  regular  Is patient on multiple antipsychotic therapies at discharge:  No   Has Patient had three or more failed trials of antipsychotic monotherapy by history:  No  Recommended Plan for Multiple Antipsychotic Therapies: NA    Rebecca Lipinski MD 05/25/2014, 10:29 AM

## 2014-05-25 NOTE — Plan of Care (Signed)
Problem: Ineffective individual coping Goal: STG-Increase in ability to manage activities of daily living Outcome: Progressing Pt took shower this morning  Problem: Diagnosis: Increased Risk For Suicide Attempt Goal: LTG-Patient Will Show Positive Response to Medication LTG (by discharge) : Patient will show positive response to medication and will participate in the development of the discharge plan.  Outcome: Completed/Met Date Met:  05/25/14 Pt denies SI/HI/AVH. Pt stated her depression has gotten better since being here. Pt only felt depressed yesterday when she found out group home would not take her Goal: LTG-Patient Will Report Absence of Withdrawal Symptoms LTG (by discharge): Patient will report absence of withdrawal symptoms.  Outcome: Completed/Met Date Met:  05/25/14 Pt denies any withdrawal Sx

## 2014-05-25 NOTE — Progress Notes (Addendum)
[facility] Discharge Plan :  Will you be returning to the same living situation after discharge: No. [facility].  They were reluctant to take her based on no MCD, and, in fact, as recently as this AM at 8:30 they stated they still did not have enough information to make a decision.  I gave them until noon to make a decision, and when they did not call, called ACT team to come get her and take her back to her residence. They called back to tell me [facility] had called them at 1:30 to let them know they would take Sheralyn Boatmanoni. At discharge, do you have transportation home?:Yes,  ACT team Do you have the ability to pay for your medications:Yes,  MCR/ACT team  Release of information consent forms completed and in the chart;  Patient's signature needed at discharge.    Patient to Follow up at: Follow-up Information    Follow up with [facility].   Why:  Trey PaulaJeff from [facility] will pick you up the day of d/c   Contact information:   [address]  [phone]      Patient denies SI/HI:   Yes,  yes    Safety Planning and Suicide Prevention discussed:  Yes,  yes  Ida Rogueorth, Yesly Gerety B 05/25/2014, 2:46 PM

## 2014-05-25 NOTE — Progress Notes (Signed)
D: Pt is alert and oriented. Pt's mood and affect is appropriate. Pt reports she is ready to go home today. Pt states she slept "ok." Pt denies SI/HI and AVH at this time. Pt is attending groups. A: Medications administered per providers orders (See MAR). 15 minute checks completed per protocol for pt safety. R: Pt cooperative and receptive to nursing interventions.

## 2014-05-25 NOTE — Progress Notes (Signed)
DISCHARGE NOTE: D: Patient was alert and oriented at discharge. Pt was ambulatory with a steady gait and in stable condition. Pt denies SI/HI and AVH. Pt states she is ready to go to the group home. A: AVS reviewed and given to pt. Follow up reviewed. Medications/Prescriptions given to pt. Resources reviewed. Pt was given the opportunity to ask questions and express concerns. Belongings returned to pt. R: Pt D/C'd to group home representative.
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
## 2014-05-28 NOTE — Progress Notes (Signed)
Patient Discharge Instructions:  After Visit Summary (AVS):   Faxed to:  05/28/14 Discharge Summary Note:   Faxed to:  05/28/14 Psychiatric Admission Assessment Note:   Faxed to:  05/28/14 Suicide Risk Assessment - Discharge Assessment:   Faxed to:  05/28/14 Faxed/Sent to the Next Level Care provider:  05/28/14 Faxed to [facility] @ [phone]  Jerelene ReddenSheena E Stanton, 05/28/2014, 3:10 PM

## 2014-06-09 ENCOUNTER — Other Ambulatory Visit: Payer: Self-pay | Admitting: Family Medicine

## 2014-06-09 DIAGNOSIS — Z1231 Encounter for screening mammogram for malignant neoplasm of breast: Secondary | ICD-10-CM

## 2014-06-29 ENCOUNTER — Ambulatory Visit
Admission: RE | Admit: 2014-06-29 | Discharge: 2014-06-29 | Disposition: A | Discharge status: Home / Self Care | Payer: Medicare Other | Source: Ambulatory Visit | Attending: Family Medicine | Admitting: Family Medicine

## 2014-06-29 DIAGNOSIS — Z1231 Encounter for screening mammogram for malignant neoplasm of breast: Secondary | ICD-10-CM

## 2016-02-14 ENCOUNTER — Other Ambulatory Visit: Payer: Self-pay | Admitting: Family Medicine

## 2016-02-14 DIAGNOSIS — K219 Gastro-esophageal reflux disease without esophagitis: Secondary | ICD-10-CM

## 2016-02-14 DIAGNOSIS — R112 Nausea with vomiting, unspecified: Secondary | ICD-10-CM

## 2016-02-20 ENCOUNTER — Ambulatory Visit
Admission: RE | Admit: 2016-02-20 | Discharge: 2016-02-20 | Disposition: A | Discharge status: Home / Self Care | Payer: Medicare Other | Source: Ambulatory Visit | Attending: Family Medicine | Admitting: Family Medicine

## 2016-02-20 DIAGNOSIS — R112 Nausea with vomiting, unspecified: Secondary | ICD-10-CM

## 2016-02-20 DIAGNOSIS — K219 Gastro-esophageal reflux disease without esophagitis: Secondary | ICD-10-CM

## 2016-03-08 ENCOUNTER — Other Ambulatory Visit: Payer: Self-pay | Admitting: Gastroenterology

## 2016-03-08 DIAGNOSIS — R112 Nausea with vomiting, unspecified: Secondary | ICD-10-CM

## 2016-03-08 DIAGNOSIS — K219 Gastro-esophageal reflux disease without esophagitis: Secondary | ICD-10-CM

## 2016-03-12 ENCOUNTER — Other Ambulatory Visit: Payer: Self-pay

## 2016-03-15 ENCOUNTER — Ambulatory Visit
Admission: RE | Admit: 2016-03-15 | Discharge: 2016-03-15 | Disposition: A | Discharge status: Home / Self Care | Payer: Medicare Other | Source: Ambulatory Visit | Attending: Gastroenterology | Admitting: Gastroenterology

## 2016-03-15 DIAGNOSIS — K219 Gastro-esophageal reflux disease without esophagitis: Secondary | ICD-10-CM

## 2016-03-15 DIAGNOSIS — R112 Nausea with vomiting, unspecified: Secondary | ICD-10-CM

## 2017-03-10 ENCOUNTER — Encounter (HOSPITAL_COMMUNITY): Payer: Self-pay | Admitting: Family Medicine

## 2017-03-10 ENCOUNTER — Ambulatory Visit (HOSPITAL_COMMUNITY)
Admission: EM | Admit: 2017-03-10 | Discharge: 2017-03-10 | Disposition: A | Discharge status: Home / Self Care | Payer: Medicare Other | Attending: Family Medicine | Admitting: Family Medicine

## 2017-03-10 DIAGNOSIS — N390 Urinary tract infection, site not specified: Secondary | ICD-10-CM

## 2017-03-10 DIAGNOSIS — R300 Dysuria: Secondary | ICD-10-CM | POA: Diagnosis not present

## 2017-03-10 LAB — POCT URINALYSIS DIP (DEVICE)
BILIRUBIN URINE: NEGATIVE
GLUCOSE, UA: NEGATIVE mg/dL
Hgb urine dipstick: NEGATIVE
KETONES UR: 15 mg/dL — AB
Nitrite: NEGATIVE
Protein, ur: NEGATIVE mg/dL
Urobilinogen, UA: 0.2 mg/dL (ref 0.0–1.0)
pH: 6.5 (ref 5.0–8.0)

## 2017-03-10 MED ORDER — SULFAMETHOXAZOLE-TRIMETHOPRIM 800-160 MG PO TABS: 1.0000 | ORAL_TABLET | Freq: Two times a day (BID) | ORAL | 0 refills | Status: DC

## 2017-03-10 MED ORDER — PHENAZOPYRIDINE HCL 200 MG PO TABS
200.0000 mg | ORAL_TABLET | Freq: Three times a day (TID) | ORAL | 0 refills | Status: AC | PRN
Start: 2017-03-10 — End: ?

## 2017-03-10 MED ORDER — SULFAMETHOXAZOLE-TRIMETHOPRIM 800-160 MG PO TABS: 1.0000 | ORAL_TABLET | Freq: Two times a day (BID) | ORAL | 0 refills | Status: AC

## 2017-03-10 NOTE — ED Provider Notes (Signed)
  [facility]   [id number] 03/10/17 Arrival Time: 1218   SUBJECTIVE:  [name] is a 56 y.o. female who presents to the urgent care  with complaint of dysuria, frequency, urgency. She denies flank pain, denies nausea vomiting or chills. No vaginal itching or discharge.  ROS: As per HPI, remainder of ROS negative.   OBJECTIVE:  Vitals:   03/10/17 1308  BP: 116/70  Pulse: 96  Resp: 18  Temp: 98.7 F (37.1 C)  SpO2: 98%     General appearance: alert; no distress HEENT: normocephalic; atraumatic; conjunctivae normal;  Lungs: clear to auscultation bilaterally Heart: regular rate and rhythm Abdomen: soft, non-tender; bowel sounds normal; no masses or organomegaly; no guarding or rebound tenderness Musculoskeletal/extremities: No CVA tenderness, pulses +2 bilaterally, grossly symmetrical Skin: warm and dry Neurologic: Grossly normal Psychological:  alert and cooperative; normal mood and affect     ASSESSMENT & PLAN:  1. Lower urinary tract infectious disease     Meds ordered this encounter  Medications  . DISCONTD: sulfamethoxazole-trimethoprim (BACTRIM DS,SEPTRA DS) 800-160 MG tablet    Sig: Take 1 tablet by mouth 2 (two) times daily.    Dispense:  14 tablet    Refill:  0    Order Specific Question:   Supervising Provider    Answer:   Elvina Sidle [[id number]]  . phenazopyridine (PYRIDIUM) 200 MG tablet    Sig: Take 1 tablet (200 mg total) by mouth 3 (three) times daily as needed for pain.    Dispense:  10 tablet    Refill:  0    Order Specific Question:   Supervising Provider    Answer:   Elvina Sidle [[id number]]  . sulfamethoxazole-trimethoprim (BACTRIM DS,SEPTRA DS) 800-160 MG tablet    Sig: Take 1 tablet by mouth 2 (two) times daily.    Dispense:  10 tablet    Refill:  0    Order Specific Question:   Supervising Provider    Answer:   Elvina Sidle [[id number]]    Started on Bactrim for 5 days, Pyridium for pain. Follow-up with primary care as  needed  Reviewed expectations re: course of current medical issues. Questions answered. Outlined signs and symptoms indicating need for more acute intervention. Patient verbalized understanding. After Visit Summary given.    Procedures:     Results for orders placed or performed during the hospital encounter of 03/10/17  POCT urinalysis dip (device)  Result Value Ref Range   Glucose, UA NEGATIVE NEGATIVE mg/dL   Bilirubin Urine NEGATIVE NEGATIVE   Ketones, ur 15 (A) NEGATIVE mg/dL   Specific Gravity, Urine <=1.005 1.005 - 1.030   Hgb urine dipstick NEGATIVE NEGATIVE   pH 6.5 5.0 - 8.0   Protein, ur NEGATIVE NEGATIVE mg/dL   Urobilinogen, UA 0.2 0.0 - 1.0 mg/dL   Nitrite NEGATIVE NEGATIVE   Leukocytes, UA TRACE (A) NEGATIVE    Labs Reviewed  POCT URINALYSIS DIP (DEVICE) - Abnormal; Notable for the following:       Result Value   Ketones, ur 15 (*)    Leukocytes, UA TRACE (*)    All other components within normal limits    No results found.  Allergies  Allergen Reactions  . Benzodiazepines Other (See Comments)    Suicidal ideation on Klonopin  . Penicillins Rash    PMHx, SurgHx, SocialHx, Medications, and Allergies were reviewed in the Visit Navigator and updated as appropriate.       Dorena Bodo, NP 03/10/17 1324

## 2017-03-10 NOTE — ED Triage Notes (Signed)
Pt here for urinary frequency for 3 days and worsening. Denies fever, chill, hematuria, urgency. Denise abd pain, back pain.

## 2017-03-10 NOTE — Discharge Instructions (Signed)
You are being treated today for a urinary tract infection. I have prescribed Bactrim, one tablet twice a day for 5 days. I have also prescribed Pyridium. Take 1 tablet a day 3 times a day for 2 days. Drink plenty of fluids and rest. Should your symptoms fail to resolve, follow up with your primary care provider or return to clinic.

## 2019-10-31 ENCOUNTER — Encounter (HOSPITAL_COMMUNITY): Payer: Self-pay | Admitting: Emergency Medicine

## 2019-10-31 ENCOUNTER — Emergency Department (HOSPITAL_COMMUNITY)
Admission: EM | Admit: 2019-10-31 | Discharge: 2019-11-21 | Disposition: E | Payer: Medicare Other | Attending: Emergency Medicine | Admitting: Emergency Medicine

## 2019-10-31 DIAGNOSIS — F17210 Nicotine dependence, cigarettes, uncomplicated: Secondary | ICD-10-CM | POA: Insufficient documentation

## 2019-10-31 DIAGNOSIS — J449 Chronic obstructive pulmonary disease, unspecified: Secondary | ICD-10-CM | POA: Insufficient documentation

## 2019-10-31 DIAGNOSIS — K219 Gastro-esophageal reflux disease without esophagitis: Secondary | ICD-10-CM | POA: Insufficient documentation

## 2019-10-31 DIAGNOSIS — I469 Cardiac arrest, cause unspecified: Secondary | ICD-10-CM | POA: Insufficient documentation

## 2019-10-31 MED ORDER — SODIUM BICARBONATE 8.4 % IV SOLN
INTRAVENOUS | Status: AC | PRN
  Administered 2019-10-31: 50 meq via INTRAVENOUS

## 2019-10-31 MED ORDER — EPINEPHRINE 1 MG/10ML IJ SOSY
PREFILLED_SYRINGE | INTRAMUSCULAR | Status: AC | PRN
Start: 2019-10-31 — End: 2019-10-31
  Administered 2019-10-31 (×2): 1 mg via INTRAVENOUS

## 2019-10-31 MED ORDER — CALCIUM CHLORIDE 10 % IV SOLN
INTRAVENOUS | Status: AC | PRN
  Administered 2019-10-31: 1 g via INTRAVENOUS

## 2019-11-01 MED FILL — Medication: Qty: 1 | Status: AC

## 2019-11-21 NOTE — ED Triage Notes (Signed)
Pt BIB [facility] CPR in progress. Pt is a resident at a group home, found unresponsive by staff. On EMS arrival, pt in asystole, CPR started at 1620, given 3epi by EMS.

## 2019-11-21 NOTE — ED Provider Notes (Signed)
[facility]GENCY DEPARTMENT Provider Note  CSN: [id number] Arrival date & time: 11/03/19 1656    History Chief Complaint  Patient presents with  . Cardiac Arrest    HPI  [name] is a 59 y.o. female brought to the ED via EMS after being found unresponsive at her group home. Per EMS, patient's initial rhythm was asystole. She was intubated with a [place], an NG tube was placed and she had multiple rounds of epi and chest compressions without ROSC. She has COPD and GERD, but no other known medical problems.    Past Medical History:  Diagnosis Date  . Anxiety   . Bipolar affective ([place])    complient with meds  . Bipolar mood disorder ([place])   . Depression   . Gastro - esophageal reflux disease   . Schizoaffective disorder     Past Surgical History:  Procedure Laterality Date  . TUBAL LIGATION      Family History  Problem Relation Age of Onset  . Diabetes Mother   . Heart failure Father     Social History   Tobacco Use  . Smoking status: Current Every Day Smoker    Packs/day: 2.00    Years: 20.00    Pack years: 40.00    Types: Cigarettes  Substance Use Topics  . Alcohol use: No    Comment: stated she has not drank in many years  . Drug use: No    Comment: denied drug use     Home Medications Prior to Admission medications   Medication Sig Start Date End Date Taking? Authorizing Provider  benztropine (COGENTIN) 0.5 MG tablet Take 1 tablet (0.5 mg total) by mouth 2 (two) times daily. 05/24/14   Kerrie Buffalo, NP  divalproex (DEPAKOTE ER) 250 MG 24 hr tablet Take 3 tablets (750 mg total) by mouth every evening. Mood Stabilization 05/24/14   Kerrie Buffalo, NP  gabapentin (NEURONTIN) 100 MG capsule Take 2 capsules (200 mg total) by mouth 3 (three) times daily. Pain and anxiety 05/24/14   Kerrie Buffalo, NP  mirtazapine (REMERON) 15 MG tablet Take 1 tablet (15 mg total) by mouth at bedtime. 05/24/14   Kerrie Buffalo, NP  phenazopyridine  (PYRIDIUM) 200 MG tablet Take 1 tablet (200 mg total) by mouth 3 (three) times daily as needed for pain. 03/10/17   Barnet Glasgow, NP  risperiDONE (RISPERDAL) 3 MG tablet Take 1 tablet (3 mg total) by mouth 2 (two) times daily. 05/24/14   Kerrie Buffalo, NP     Allergies    Benzodiazepines and Penicillins   Review of Systems   Review of Systems  Unable to perform ROS: Intubated     Physical Exam Pulse (!) 0   Ht 5\' 10"  (1.778 m)   Wt 102.1 kg   LMP 07/23/2004   BMI 32.28 kg/m   Physical Exam Constitutional:      Comments: Unresponsive  HENT:     Head: Atraumatic.  Cardiovascular:     Comments: Pulseless Pulmonary:     Comments: Apneic, equal breath sounds with BVT ventilations Abdominal:     Palpations: Abdomen is soft.  Musculoskeletal:        General: No deformity.     Cervical back: Neck supple.  Skin:    Comments: Cool to touch  Neurological:     Comments: Unable to assess   Psychiatric:     Comments: Unable to assess       ED Results / Procedures / Treatments  Labs (all labs ordered are listed, but only abnormal results are displayed) Labs Reviewed - No data to display  EKG None  Radiology No results found.  Procedures Procedures  Medications Ordered in the ED Medications  EPINEPHrine (ADRENALIN) 1 MG/10ML injection (1 mg Intravenous Given 11/06/2019 1701)  calcium chloride injection (1 g Intravenous Given 11-06-19 1700)  sodium bicarbonate injection (50 mEq Intravenous Given 2019-11-06 1701)     ED Course  I have reviewed the triage vital signs and the nursing notes.  Pertinent labs & imaging results that were available during my care of the patient were reviewed by me and considered in my medical decision making (see chart for details).  Clinical Course as of Oct 31 1738  Sat 06-Nov-2019  1734 Patient arrived in asystole with IO access and [facility] airway in place. She had a peripheral IV placed, was given two additional doses of Epi as  well as Calcium and Bicarb without ROSC. Further efforts were deemed futile and she was pronounced dead at 1704   [CS]  1735 I spoke with Caroline More, administrator at the patient's group home who reports she was called at 1600 by staff to report that the patient was on the ground outside the facility. She spoke with the patient then and the patient told her that her legs had given out and she was unable to get up but did not want EMS called. About min later, while on her way to check on the patient, she was called back by staff who reported the patient was unresponsive and not breathing. EMS was called at that time. Jerrye Beavers states that the patient has been in her normal state of health recently. She has a history of COPD and GERD and continues to smoke, but does not have HTN, DM, HLD, CAD or PVD. She has not expressed any depression or SI recently and does not have access to her medications which are administered by the Group Home staff. Jerrye Beavers was informed of the patient's death. She states that the patient has no next of kin and makes her own health care decisions (no HCPOA). She also reports that the patient recently started seeing a new PCP,, Marlowe Aschoff, NP with [facility]. I also spoke with Ms. Polite who likely reports no known medical problems. She will be willing to sign the death certificate if the ME does not feel they need to be involved. Spoke with Dr. Deloria Lair, ME who does not feel the patient needs to be an ME and so the death certificate will be sent to Ms. Polite's office for signature.    [CS]    Clinical Course User Index [CS] Pollyann Savoy, MD    MDM Rules/Calculators/A&P MDM  Final Clinical Impression(s) / ED Diagnoses Final diagnoses:  Cardiac arrest [facility])    Rx / DC Orders ED Discharge Orders    None       Pollyann Savoy, MD 06-Nov-2019 1740

## 2019-11-21 DEATH — deceased
